# Patient Record
Sex: Female | Born: 1985 | Race: Black or African American | Hispanic: No | Marital: Single
Health system: Southern US, Community
[De-identification: ages and names within clinical notes are randomized; demographics above are authoritative.]

## PROBLEM LIST (undated history)

## (undated) DIAGNOSIS — F329 Major depressive disorder, single episode, unspecified: Secondary | ICD-10-CM

## (undated) DIAGNOSIS — N760 Acute vaginitis: Secondary | ICD-10-CM

## (undated) DIAGNOSIS — D649 Anemia, unspecified: Secondary | ICD-10-CM

## (undated) DIAGNOSIS — M549 Dorsalgia, unspecified: Secondary | ICD-10-CM

## (undated) DIAGNOSIS — G8929 Other chronic pain: Secondary | ICD-10-CM

## (undated) DIAGNOSIS — K219 Gastro-esophageal reflux disease without esophagitis: Secondary | ICD-10-CM

## (undated) DIAGNOSIS — F32A Depression, unspecified: Secondary | ICD-10-CM

## (undated) DIAGNOSIS — F419 Anxiety disorder, unspecified: Secondary | ICD-10-CM

## (undated) DIAGNOSIS — Z9289 Personal history of other medical treatment: Secondary | ICD-10-CM

## (undated) DIAGNOSIS — F209 Schizophrenia, unspecified: Secondary | ICD-10-CM

## (undated) DIAGNOSIS — S069X9A Unspecified intracranial injury with loss of consciousness of unspecified duration, initial encounter: Secondary | ICD-10-CM

## (undated) DIAGNOSIS — E119 Type 2 diabetes mellitus without complications: Secondary | ICD-10-CM

## (undated) DIAGNOSIS — N939 Abnormal uterine and vaginal bleeding, unspecified: Secondary | ICD-10-CM

## (undated) DIAGNOSIS — F319 Bipolar disorder, unspecified: Secondary | ICD-10-CM

## (undated) DIAGNOSIS — B9689 Other specified bacterial agents as the cause of diseases classified elsewhere: Secondary | ICD-10-CM

## (undated) HISTORY — PX: FRACTURE SURGERY: SHX138

## (undated) HISTORY — PX: INDUCED ABORTION: SHX677

## (undated) HISTORY — DX: Abnormal uterine and vaginal bleeding, unspecified: N93.9

## (undated) HISTORY — DX: Other specified bacterial agents as the cause of diseases classified elsewhere: B96.89

## (undated) HISTORY — DX: Acute vaginitis: N76.0

---

## 2006-02-16 ENCOUNTER — Emergency Department (HOSPITAL_COMMUNITY): Admission: EM | Admit: 2006-02-16 | Discharge: 2006-02-16 | Payer: Self-pay | Admitting: Emergency Medicine

## 2006-04-12 ENCOUNTER — Ambulatory Visit (HOSPITAL_COMMUNITY): Admission: RE | Admit: 2006-04-12 | Discharge: 2006-04-12 | Payer: Self-pay | Admitting: Obstetrics and Gynecology

## 2006-07-21 ENCOUNTER — Observation Stay (HOSPITAL_COMMUNITY): Admission: EM | Admit: 2006-07-21 | Discharge: 2006-07-22 | Payer: Self-pay | Admitting: Obstetrics and Gynecology

## 2006-08-05 ENCOUNTER — Inpatient Hospital Stay (HOSPITAL_COMMUNITY): Admission: RE | Admit: 2006-08-05 | Discharge: 2006-08-07 | Payer: Self-pay | Admitting: Obstetrics and Gynecology

## 2008-08-10 ENCOUNTER — Emergency Department (HOSPITAL_COMMUNITY): Admission: EM | Admit: 2008-08-10 | Discharge: 2008-08-10 | Payer: Self-pay | Admitting: Emergency Medicine

## 2008-11-26 ENCOUNTER — Observation Stay (HOSPITAL_COMMUNITY): Admission: AD | Admit: 2008-11-26 | Discharge: 2008-11-27 | Payer: Self-pay | Admitting: Obstetrics and Gynecology

## 2008-12-13 ENCOUNTER — Other Ambulatory Visit: Payer: Self-pay | Admitting: Emergency Medicine

## 2008-12-13 ENCOUNTER — Observation Stay (HOSPITAL_COMMUNITY): Admission: AD | Admit: 2008-12-13 | Discharge: 2008-12-14 | Payer: Self-pay | Admitting: Obstetrics and Gynecology

## 2009-06-30 ENCOUNTER — Inpatient Hospital Stay (HOSPITAL_COMMUNITY): Admission: EM | Admit: 2009-06-30 | Discharge: 2009-07-05 | Payer: Self-pay | Admitting: Emergency Medicine

## 2009-06-30 DIAGNOSIS — S069X9A Unspecified intracranial injury with loss of consciousness of unspecified duration, initial encounter: Secondary | ICD-10-CM

## 2009-06-30 HISTORY — PX: CLOSED REDUCTION MANDIBULAR FRACTURE: SUR255

## 2009-06-30 HISTORY — DX: Unspecified intracranial injury with loss of consciousness of unspecified duration, initial encounter: S06.9X9A

## 2009-07-02 ENCOUNTER — Ambulatory Visit: Payer: Self-pay | Admitting: Physical Medicine & Rehabilitation

## 2009-07-05 ENCOUNTER — Inpatient Hospital Stay (HOSPITAL_COMMUNITY)
Admission: RE | Admit: 2009-07-05 | Discharge: 2009-07-12 | Payer: Self-pay | Admitting: Physical Medicine & Rehabilitation

## 2009-07-05 ENCOUNTER — Ambulatory Visit: Payer: Self-pay | Admitting: Physical Medicine & Rehabilitation

## 2009-07-18 ENCOUNTER — Encounter (HOSPITAL_COMMUNITY)
Admission: RE | Admit: 2009-07-18 | Discharge: 2009-08-17 | Payer: Self-pay | Admitting: Physical Medicine & Rehabilitation

## 2009-08-09 ENCOUNTER — Encounter
Admission: RE | Admit: 2009-08-09 | Discharge: 2009-08-29 | Payer: Self-pay | Admitting: Physical Medicine & Rehabilitation

## 2009-08-13 ENCOUNTER — Ambulatory Visit: Payer: Self-pay | Admitting: Physical Medicine & Rehabilitation

## 2009-08-20 ENCOUNTER — Ambulatory Visit (HOSPITAL_COMMUNITY): Admission: RE | Admit: 2009-08-20 | Discharge: 2009-08-20 | Payer: Self-pay | Admitting: Otolaryngology

## 2009-08-20 ENCOUNTER — Encounter: Admission: RE | Admit: 2009-08-20 | Discharge: 2009-08-20 | Payer: Self-pay | Admitting: Neurosurgery

## 2009-08-22 ENCOUNTER — Encounter (HOSPITAL_COMMUNITY)
Admission: RE | Admit: 2009-08-22 | Discharge: 2009-09-06 | Payer: Self-pay | Admitting: Physical Medicine & Rehabilitation

## 2009-09-09 ENCOUNTER — Encounter (HOSPITAL_COMMUNITY)
Admission: RE | Admit: 2009-09-09 | Discharge: 2009-10-09 | Payer: Self-pay | Admitting: Physical Medicine & Rehabilitation

## 2010-02-22 ENCOUNTER — Emergency Department (HOSPITAL_COMMUNITY): Admission: EM | Admit: 2010-02-22 | Discharge: 2010-02-22 | Payer: Self-pay | Admitting: Emergency Medicine

## 2010-09-28 ENCOUNTER — Encounter: Payer: Self-pay | Admitting: Neurosurgery

## 2010-10-13 ENCOUNTER — Emergency Department (HOSPITAL_COMMUNITY)
Admission: EM | Admit: 2010-10-13 | Discharge: 2010-10-13 | Disposition: A | Discharge status: Home / Self Care | Payer: Medicaid Other | Attending: Emergency Medicine | Admitting: Emergency Medicine

## 2010-10-13 DIAGNOSIS — K089 Disorder of teeth and supporting structures, unspecified: Secondary | ICD-10-CM | POA: Insufficient documentation

## 2010-10-13 DIAGNOSIS — K047 Periapical abscess without sinus: Secondary | ICD-10-CM | POA: Insufficient documentation

## 2010-12-10 LAB — COMPREHENSIVE METABOLIC PANEL
CO2: 26 mEq/L (ref 19–32)
Calcium: 9 mg/dL (ref 8.4–10.5)
GFR calc Af Amer: >60 mL/min (ref >60)
Glucose, Bld: 96 mg/dL (ref 70–99)
Sodium: 137 mEq/L (ref 135–145)
Total Protein: 7.7 g/dL (ref 6.0–8.3)

## 2010-12-10 LAB — DIFFERENTIAL
Basophils Absolute: 0.1 10*3/uL (ref 0.0–0.1)
Eosinophils Relative: 2 % (ref 0–5)
Lymphocytes Relative: 23 % (ref 12–46)
Lymphs Abs: 2.4 10*3/uL (ref 0.7–4.0)
Monocytes Absolute: 1.3 10*3/uL — ABNORMAL HIGH (ref 0.1–1.0)
Monocytes Relative: 13 % — ABNORMAL HIGH (ref 3–12)
Neutro Abs: 6.5 10*3/uL (ref 1.7–7.7)

## 2010-12-10 LAB — CBC
Hemoglobin: 10.4 g/dL — ABNORMAL LOW (ref 12.0–15.0)
MCHC: 33.3 g/dL (ref 30.0–36.0)
MCV: 90 fL (ref 78.0–100.0)
WBC: 10.4 10*3/uL (ref 4.0–10.5)

## 2010-12-11 LAB — RAPID URINE DRUG SCREEN, HOSP PERFORMED
Amphetamines: NOT DETECTED
Barbiturates: NOT DETECTED
Benzodiazepines: NOT DETECTED
Cocaine: POSITIVE — AB
Tetrahydrocannabinol: POSITIVE — AB

## 2010-12-11 LAB — URINALYSIS, MICROSCOPIC ONLY
Bilirubin Urine: NEGATIVE
Ketones, ur: NEGATIVE mg/dL
Protein, ur: NEGATIVE mg/dL
Urobilinogen, UA: 0.2 mg/dL (ref 0.0–1.0)

## 2010-12-11 LAB — COMPREHENSIVE METABOLIC PANEL
ALT: 49 U/L — ABNORMAL HIGH (ref 0–35)
AST: 76 U/L — ABNORMAL HIGH (ref 0–37)
Albumin: 3.4 g/dL — ABNORMAL LOW (ref 3.5–5.2)
Alkaline Phosphatase: 49 U/L (ref 39–117)
BUN: 10 mg/dL (ref 6–23)
Chloride: 103 mEq/L (ref 96–112)
GFR calc non Af Amer: >60 mL/min (ref >60)
Total Bilirubin: 0.4 mg/dL (ref 0.3–1.2)

## 2010-12-11 LAB — URINE CULTURE: Culture: NO GROWTH

## 2010-12-11 LAB — APTT: aPTT: 26 seconds (ref 24–37)

## 2010-12-11 LAB — LIPASE, BLOOD: Lipase: 15 U/L (ref 11–59)

## 2010-12-11 LAB — ETHANOL: Alcohol, Ethyl (B): <5 mg/dL (ref 0–10)

## 2010-12-11 LAB — URINALYSIS, ROUTINE W REFLEX MICROSCOPIC
Bilirubin Urine: NEGATIVE
Specific Gravity, Urine: 1.021 (ref 1.005–1.030)
pH: 5.5 (ref 5.0–8.0)

## 2010-12-11 LAB — DIFFERENTIAL
Basophils Absolute: 0.1 10*3/uL (ref 0.0–0.1)
Lymphocytes Relative: 8 % — ABNORMAL LOW (ref 12–46)
Monocytes Absolute: 1.5 10*3/uL — ABNORMAL HIGH (ref 0.1–1.0)
Neutro Abs: 19 10*3/uL — ABNORMAL HIGH (ref 1.7–7.7)

## 2010-12-11 LAB — CBC
Hemoglobin: 11.5 g/dL — ABNORMAL LOW (ref 12.0–15.0)
Platelets: 290 10*3/uL (ref 150–400)
Platelets: 316 10*3/uL (ref 150–400)
RDW: 13.1 % (ref 11.5–15.5)

## 2010-12-11 LAB — URINE MICROSCOPIC-ADD ON

## 2010-12-11 LAB — PROTIME-INR: INR: 1.06 (ref 0.00–1.49)

## 2010-12-17 LAB — PROTIME-INR: Prothrombin Time: 15.5 seconds — ABNORMAL HIGH (ref 11.6–15.2)

## 2010-12-17 LAB — POCT I-STAT, CHEM 8
BUN: 8 mg/dL (ref 6–23)
Chloride: 107 mEq/L (ref 96–112)
Creatinine, Ser: 1.3 mg/dL — ABNORMAL HIGH (ref 0.4–1.2)
Potassium: 3.3 mEq/L — ABNORMAL LOW (ref 3.5–5.1)
Sodium: 141 mEq/L (ref 135–145)
TCO2: 21 mmol/L (ref 0–100)

## 2010-12-17 LAB — CBC
HCT: 19.9 % — ABNORMAL LOW (ref 36.0–46.0)
Hemoglobin: 6.7 g/dL — CL (ref 12.0–15.0)
MCHC: 32.7 g/dL (ref 30.0–36.0)
Platelets: 478 10*3/uL — ABNORMAL HIGH (ref 150–400)
Platelets: 276 10*3/uL (ref 150–400)
RDW: 14.4 % (ref 11.5–15.5)
WBC: 6.9 10*3/uL (ref 4.0–10.5)

## 2010-12-17 LAB — SAMPLE TO BLOOD BANK

## 2010-12-17 LAB — DIFFERENTIAL
Eosinophils Relative: 1 % (ref 0–5)
Lymphocytes Relative: 54 % — ABNORMAL HIGH (ref 12–46)
Lymphs Abs: 3.7 10*3/uL (ref 0.7–4.0)
Neutro Abs: 2.5 10*3/uL (ref 1.7–7.7)

## 2010-12-17 LAB — CROSSMATCH
ABO/RH(D): O POS
ABO/RH(D): O POS
Antibody Screen: NEGATIVE
Antibody Screen: NEGATIVE

## 2010-12-17 LAB — HEMOGLOBIN AND HEMATOCRIT, BLOOD
HCT: 21.2 % — ABNORMAL LOW (ref 36.0–46.0)
Hemoglobin: 6.9 g/dL — CL (ref 12.0–15.0)

## 2010-12-17 LAB — BASIC METABOLIC PANEL
BUN: 8 mg/dL (ref 6–23)
Calcium: 7.7 mg/dL — ABNORMAL LOW (ref 8.4–10.5)
GFR calc non Af Amer: 56 mL/min — ABNORMAL LOW (ref >60)
Potassium: 3.3 mEq/L — ABNORMAL LOW (ref 3.5–5.1)
Sodium: 140 mEq/L (ref 135–145)

## 2010-12-17 LAB — APTT: aPTT: 25 seconds (ref 24–37)

## 2010-12-18 LAB — DIFFERENTIAL
Basophils Absolute: 0 10*3/uL (ref 0.0–0.1)
Eosinophils Relative: 2 % (ref 0–5)
Lymphocytes Relative: 37 % (ref 12–46)
Lymphs Abs: 2.9 10*3/uL (ref 0.7–4.0)
Monocytes Relative: 9 % (ref 3–12)

## 2010-12-18 LAB — CROSSMATCH
ABO/RH(D): O POS
Antibody Screen: NEGATIVE

## 2010-12-18 LAB — CBC
HCT: 21.5 % — ABNORMAL LOW (ref 36.0–46.0)
HCT: 30.9 % — ABNORMAL LOW (ref 36.0–46.0)
Hemoglobin: 10.3 g/dL — ABNORMAL LOW (ref 12.0–15.0)
MCHC: 33.6 g/dL (ref 30.0–36.0)
MCV: 88.9 fL (ref 78.0–100.0)
Platelets: 226 10*3/uL (ref 150–400)
Platelets: 316 10*3/uL (ref 150–400)
RDW: 13.1 % (ref 11.5–15.5)
WBC: 10.9 10*3/uL — ABNORMAL HIGH (ref 4.0–10.5)
WBC: 7.8 10*3/uL (ref 4.0–10.5)

## 2010-12-18 LAB — BASIC METABOLIC PANEL
BUN: 4 mg/dL — ABNORMAL LOW (ref 6–23)
Calcium: 8.6 mg/dL (ref 8.4–10.5)
GFR calc non Af Amer: >60 mL/min (ref >60)
Glucose, Bld: 115 mg/dL — ABNORMAL HIGH (ref 70–99)
Potassium: 3.4 mEq/L — ABNORMAL LOW (ref 3.5–5.1)
Sodium: 133 mEq/L — ABNORMAL LOW (ref 135–145)

## 2010-12-18 LAB — APTT: aPTT: 26 seconds (ref 24–37)

## 2010-12-18 LAB — ABO/RH: ABO/RH(D): O POS

## 2010-12-18 LAB — PROTIME-INR: Prothrombin Time: 14.7 seconds (ref 11.6–15.2)

## 2011-01-23 NOTE — Op Note (Signed)
NAME:  TANEIL, LAZARUS NO.:  [id number]   MEDICAL RECORD NO.:  [id number]           PATIENT TYPE:   LOCATION:                                 FACILITY:   PHYSICIAN:  Tilda Burrow, M.D.      DATE OF BIRTH:   DATE OF PROCEDURE:  08/06/2006  DATE OF DISCHARGE:                               OPERATIVE REPORT   DELIVERY NOTE   SURGEON:  Tilda Burrow, M.D.   DESCRIPTION OF PROCEDURE:  Patient progressed nicely to completely  dilated and was an excellent pusher with a small 36-week baby she went  rapidly to completely dilated and crowning.  Delivery was performed by  Tilda Burrow, M.D. in a controlled fashion over an intact perineum  with first-degree lacerations only, none requiring suture repair.   The infant was a healthy 5 pound 8 ounce female infant with Apgars of 9  and 9.  Cord blood samples were obtained and placenta was delivered  easily, intact, Tomasa Blase presentation.  Estimated blood loss 250 mL.  Sponge and needle counts were correct.      Tilda Burrow, M.D.  Electronically Signed     JVF/MEDQ  D:  08/05/2006  T:  08/06/2006  Job:  [id number]   cc:   Francoise Schaumann. Milford Cage DO, FAAP  Fax: [phone]

## 2011-01-23 NOTE — Op Note (Signed)
NAME:  FOREVER, ARECHIGA NO.:  [id number]   MEDICAL RECORD NO.:  [id number]          PATIENT TYPE:  INP   LOCATION:  A402                          FACILITY:  [facility]   PHYSICIAN:  Tilda Burrow, M.D. DATE OF BIRTH:  Jan 27, 1986   DATE OF PROCEDURE:  08/05/2006  DATE OF DISCHARGE:                               OPERATIVE REPORT   OPERATION/PROCEDURE:  Epidural catheter placement.   DESCRIPTION OF PROCEDURE:  Continuous lumbar epidural catheter placed at  1:40 p.m. using loss-of-resistance technique at L2-3 interspace with  successful identification of the epidural space on first attempt at 4 cm  beneath the skin with easy introduction of a 5 mL test dose of 1.5%  Xylocaine followed by 14 mL per hour continuous infusion.  The patient  had symmetric analgesic effect at T12 and will be given an additional 7  mL bolus for complete analgesia.  Pitocin will be initiated.  Fetal  heart rate tracing is reactive with no blood pressure change.      Tilda Burrow, M.D.  Electronically Signed     JVF/MEDQ  D:  08/05/2006  T:  08/06/2006  Job:  [id number]

## 2011-01-23 NOTE — H&P (Signed)
[name], Manning            ACCOUNT NO.:  [id number]   MEDICAL RECORD NO.:  [id number]          PATIENT TYPE:  INP   LOCATION:  LDR2                          FACILITY:  [facility]   PHYSICIAN:  Tilda Burrow, M.D. DATE OF BIRTH:  03/02/1986   DATE OF ADMISSION:  08/05/2006  DATE OF DISCHARGE:  LH                              HISTORY & PHYSICAL   CHIEF COMPLAINT:  Spontaneous rupture of membranes at 0930.   HISTORY OF PRESENT ILLNESS:  Nicole Manning is a 25 year old gravida 2, para  0, AB 1 with an EDC of September 02, 2006, based on a 15-week ultrasound.  Her 20-week ultrasound gave an [facility] of August 31, 2006, placing her at  36 weeks.  She began prenatal care when she was [redacted] weeks pregnant and  has had regular visits since then.  Prenatal course has basically been  uneventful.  Blood pressures have been 100s to 140s/60s to 80s.  Total  weight gain has been 25 pounds with appropriate fundal height growth.   PRENATAL LABORATORY DATA:  Blood type O positive.  Rubella immune.  HBSAG, HIV, HSV, RPR, gonorrhea and chlamydia and sickle screen are all  negative.  One hour GTT was normal at 74.   ALLERGIES:  NO KNOWN DRUG ALLERGIES.   MEDICATIONS:  Prenatal vitamins.   PAST MEDICAL HISTORY:  History of chlamydia.   PAST SURGICAL HISTORY:  None.   SOCIAL HISTORY:  Single.  Works at [employer].  Lives with her  grandparents.  The father of the baby is jailed on assault charges.  She  denies cigarette smoking, alcohol or drug use.   FAMILY HISTORY:  Positive for hypertension, coronary artery disease.   OBSTETRICAL HISTORY:  An elective AB in 2006.   PHYSICAL EXAMINATION:  HEENT:  Within normal limits.  CARDIOVASCULAR:  Regular rate and rhythm.  ABDOMEN:  Soft and nontender.  She is having mild contractions every 3-4  minutes.  Fetal heart rate is reactive without decelerations.  CERVIX:  Grossly ruptured clear fluid.  Cervix is 3-4 cm, 100% effaced,  0 station, vertex  presentation.  EXTREMITIES:  Legs are negative.   IMPRESSION:  1. Intrauterine pregnancy at 36 weeks.  2. Preterm rupture of membranes.  3. Beginning active labor.   PLAN:  We will provide GBS prophylaxis.  The patient requests an  epidural at this time.      Jacklyn Shell, C.N.M.      Tilda Burrow, M.D.  Electronically Signed    FC/MEDQ  D:  08/05/2006  T:  08/05/2006  Job:  [id number]   cc:   [facility]   Dr. Gerda Diss
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
## 2011-05-12 ENCOUNTER — Encounter: Payer: Self-pay | Admitting: Emergency Medicine

## 2011-05-12 ENCOUNTER — Emergency Department (HOSPITAL_COMMUNITY)
Admission: EM | Admit: 2011-05-12 | Discharge: 2011-05-12 | Disposition: A | Discharge status: Home / Self Care | Payer: Medicaid Other | Attending: Emergency Medicine | Admitting: Emergency Medicine

## 2011-05-12 DIAGNOSIS — S40029A Contusion of unspecified upper arm, initial encounter: Secondary | ICD-10-CM | POA: Insufficient documentation

## 2011-05-12 DIAGNOSIS — S40019A Contusion of unspecified shoulder, initial encounter: Secondary | ICD-10-CM | POA: Insufficient documentation

## 2011-05-12 DIAGNOSIS — M549 Dorsalgia, unspecified: Secondary | ICD-10-CM | POA: Insufficient documentation

## 2011-05-12 DIAGNOSIS — F17200 Nicotine dependence, unspecified, uncomplicated: Secondary | ICD-10-CM | POA: Insufficient documentation

## 2011-05-12 DIAGNOSIS — M542 Cervicalgia: Secondary | ICD-10-CM | POA: Insufficient documentation

## 2011-05-12 DIAGNOSIS — Y92410 Unspecified street and highway as the place of occurrence of the external cause: Secondary | ICD-10-CM | POA: Insufficient documentation

## 2011-05-12 DIAGNOSIS — IMO0002 Reserved for concepts with insufficient information to code with codable children: Secondary | ICD-10-CM | POA: Insufficient documentation

## 2011-05-12 DIAGNOSIS — R51 Headache: Secondary | ICD-10-CM | POA: Insufficient documentation

## 2011-05-12 DIAGNOSIS — R079 Chest pain, unspecified: Secondary | ICD-10-CM | POA: Insufficient documentation

## 2011-05-12 MED ORDER — DIAZEPAM 5 MG PO TABS
5.0000 mg | ORAL_TABLET | Freq: Once | ORAL | Status: AC
  Administered 2011-05-12: 5 mg via ORAL
  Filled 2011-05-12: qty 1

## 2011-05-12 MED ORDER — OXYCODONE-ACETAMINOPHEN 5-325 MG PO TABS
1.0000 | ORAL_TABLET | Freq: Once | ORAL | Status: AC
  Administered 2011-05-12: 1 via ORAL
  Filled 2011-05-12: qty 1
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
## 2013-04-19 ENCOUNTER — Emergency Department (HOSPITAL_COMMUNITY)
Admission: EM | Admit: 2013-04-19 | Discharge: 2013-04-19 | Payer: Medicaid Other | Attending: Emergency Medicine | Admitting: Emergency Medicine

## 2013-04-19 ENCOUNTER — Encounter (HOSPITAL_COMMUNITY): Payer: Self-pay | Admitting: Emergency Medicine

## 2013-04-19 DIAGNOSIS — IMO0002 Reserved for concepts with insufficient information to code with codable children: Secondary | ICD-10-CM | POA: Insufficient documentation

## 2013-04-19 DIAGNOSIS — X58XXXA Exposure to other specified factors, initial encounter: Secondary | ICD-10-CM | POA: Insufficient documentation

## 2013-04-19 DIAGNOSIS — Y929 Unspecified place or not applicable: Secondary | ICD-10-CM | POA: Insufficient documentation

## 2013-04-19 DIAGNOSIS — Z8739 Personal history of other diseases of the musculoskeletal system and connective tissue: Secondary | ICD-10-CM | POA: Insufficient documentation

## 2013-04-19 DIAGNOSIS — S46911A Strain of unspecified muscle, fascia and tendon at shoulder and upper arm level, right arm, initial encounter: Secondary | ICD-10-CM

## 2013-04-19 DIAGNOSIS — F411 Generalized anxiety disorder: Secondary | ICD-10-CM | POA: Insufficient documentation

## 2013-04-19 DIAGNOSIS — Y9389 Activity, other specified: Secondary | ICD-10-CM | POA: Insufficient documentation

## 2013-04-19 DIAGNOSIS — F17200 Nicotine dependence, unspecified, uncomplicated: Secondary | ICD-10-CM | POA: Insufficient documentation

## 2013-04-19 MED ORDER — METHOCARBAMOL 500 MG PO TABS
1000.0000 mg | ORAL_TABLET | Freq: Once | ORAL | Status: AC
  Administered 2013-04-19: 1000 mg via ORAL
  Filled 2013-04-19: qty 2

## 2013-04-19 MED ORDER — KETOROLAC TROMETHAMINE 10 MG PO TABS
10.0000 mg | ORAL_TABLET | Freq: Once | ORAL | Status: AC
  Administered 2013-04-19: 10 mg via ORAL
  Filled 2013-04-19: qty 1
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
## 2013-04-21 NOTE — ED Provider Notes (Signed)
Medical screening examination/treatment/procedure(s) were performed by non-physician practitioner and as supervising physician I was immediately available for consultation/collaboration.   Jene Huq, MD 04/21/13 0647 

## 2013-10-03 ENCOUNTER — Encounter: Payer: Self-pay | Admitting: *Deleted

## 2013-10-03 ENCOUNTER — Encounter: Payer: Self-pay | Admitting: Advanced Practice Midwife

## 2013-10-18 ENCOUNTER — Other Ambulatory Visit: Payer: Self-pay | Admitting: Advanced Practice Midwife

## 2013-10-18 ENCOUNTER — Encounter: Payer: Self-pay | Admitting: *Deleted

## 2014-01-24 ENCOUNTER — Ambulatory Visit (INDEPENDENT_AMBULATORY_CARE_PROVIDER_SITE_OTHER): Payer: Medicaid Other | Admitting: Adult Health

## 2014-01-24 ENCOUNTER — Encounter: Payer: Self-pay | Admitting: Adult Health

## 2014-01-24 VITALS — BP 140/84 | Ht 61.0 in | Wt 206.0 lb

## 2014-01-24 DIAGNOSIS — N939 Abnormal uterine and vaginal bleeding, unspecified: Secondary | ICD-10-CM | POA: Insufficient documentation

## 2014-01-24 DIAGNOSIS — N760 Acute vaginitis: Secondary | ICD-10-CM

## 2014-01-24 DIAGNOSIS — B9689 Other specified bacterial agents as the cause of diseases classified elsewhere: Secondary | ICD-10-CM

## 2014-01-24 DIAGNOSIS — N926 Irregular menstruation, unspecified: Secondary | ICD-10-CM

## 2014-01-24 DIAGNOSIS — A499 Bacterial infection, unspecified: Secondary | ICD-10-CM

## 2014-01-24 DIAGNOSIS — Z3202 Encounter for pregnancy test, result negative: Secondary | ICD-10-CM

## 2014-01-24 HISTORY — DX: Acute vaginitis: B96.89

## 2014-01-24 HISTORY — DX: Abnormal uterine and vaginal bleeding, unspecified: N93.9

## 2014-01-24 LAB — POCT WET PREP (WET MOUNT): WBC WET PREP: POSITIVE

## 2014-01-24 LAB — POCT URINE PREGNANCY: Preg Test, Ur: NEGATIVE

## 2014-01-24 MED ORDER — METRONIDAZOLE 500 MG PO TABS: 500.0000 mg | ORAL_TABLET | Freq: Two times a day (BID) | ORAL | Status: DC

## 2014-01-24 NOTE — Patient Instructions (Signed)
Bacterial Vaginosis Bacterial vaginosis is a vaginal infection that occurs when the normal balance of bacteria in the vagina is disrupted. It results from an overgrowth of certain bacteria. This is the most common vaginal infection in women of childbearing age. Treatment is important to prevent complications, especially in pregnant women, as it can cause a premature delivery. CAUSES  Bacterial vaginosis is caused by an increase in harmful bacteria that are normally present in smaller amounts in the vagina. Several different kinds of bacteria can cause bacterial vaginosis. However, the reason that the condition develops is not fully understood. RISK FACTORS Certain activities or behaviors can put you at an increased risk of developing bacterial vaginosis, including:  Having a new sex partner or multiple sex partners.  Douching.  Using an intrauterine device (IUD) for contraception. Women do not get bacterial vaginosis from toilet seats, bedding, swimming pools, or contact with objects around them. SIGNS AND SYMPTOMS  Some women with bacterial vaginosis have no signs or symptoms. Common symptoms include:  Grey vaginal discharge.  A fishlike odor with discharge, especially after sexual intercourse.  Itching or burning of the vagina and vulva.  Burning or pain with urination. DIAGNOSIS  Your health care provider will take a medical history and examine the vagina for signs of bacterial vaginosis. A sample of vaginal fluid may be taken. Your health care provider will look at this sample under a microscope to check for bacteria and abnormal cells. A vaginal pH test may also be done.  TREATMENT  Bacterial vaginosis may be treated with antibiotic medicines. These may be given in the form of a pill or a vaginal cream. A second round of antibiotics may be prescribed if the condition comes back after treatment.  HOME CARE INSTRUCTIONS   Only take over-the-counter or prescription medicines as  directed by your health care provider.  If antibiotic medicine was prescribed, take it as directed. Make sure you finish it even if you start to feel better.  Do not have sex until treatment is completed.  Tell all sexual partners that you have a vaginal infection. They should see their health care provider and be treated if they have problems, such as a mild rash or itching.  Practice safe sex by using condoms and only having one sex partner. SEEK MEDICAL CARE IF:   Your symptoms are not improving after 3 days of treatment.  You have increased discharge or pain.  You have a fever. MAKE SURE YOU:   Understand these instructions.  Will watch your condition.  Will get help right away if you are not doing well or get worse. FOR MORE INFORMATION  Centers for Disease Control and Prevention, Division of STD Prevention: SolutionApps.co.zawww.cdc.gov/std American Sexual Health Association (ASHA): www.ashastd.org  Document Released: 08/24/2005 Document Revised: 06/14/2013 Document Reviewed: 04/05/2013 [facility] [employer] Patient Information 2014 [place], [place]. Return in 1 week [place]

## 2014-01-24 NOTE — Progress Notes (Signed)
Subjective:     Patient ID: Iron Belt Binghartaria S Accardi, female   DOB: 1985-10-15, 28 y.o.   MRN: [id number]  HPI Nicole Manning is a 28 year old black female in complaining of bleeding on and of since end of April,is not using birth control.Bleeding is not associated with any pain.  Review of Systems See HPI Reviewed past medical,surgical, social and family history. Reviewed medications and allergies.     Objective:   Physical Exam BP 140/84  Ht 5\' 1"  (1.549 m)  Wt 206 lb (93.441 kg)  BMI 38.94 kg/m2  LMP 04/28/2015UPT negative,   Skin warm and dry.Pelvic: external genitalia is normal in appearance, vagina: pinkish discharge with odor, cervix:smooth and bulbous, uterus: normal size, shape and contour, non tender, no masses felt, adnexa: no masses some RLQ tenderness noted. Wet prep: + for clue cells and +WBCs. GC/CHL obtained.   Assessment:     AUB BV    Plan:    GC/CHL Rx flagyl 500 mg 1 bid x 7 days, no alcohol, review handout on BV   Return in 1 week for US and see me

## 2014-01-25 LAB — GC/CHLAMYDIA PROBE AMP
CT PROBE, AMP APTIMA: NEGATIVE
GC PROBE AMP APTIMA: NEGATIVE

## 2014-01-26 ENCOUNTER — Other Ambulatory Visit: Payer: Self-pay | Admitting: Adult Health

## 2014-01-26 DIAGNOSIS — N939 Abnormal uterine and vaginal bleeding, unspecified: Secondary | ICD-10-CM

## 2014-01-30 ENCOUNTER — Other Ambulatory Visit: Payer: Medicaid Other

## 2014-01-30 ENCOUNTER — Ambulatory Visit: Payer: Medicaid Other | Admitting: Adult Health

## 2014-02-07 ENCOUNTER — Other Ambulatory Visit: Payer: Medicaid Other

## 2014-02-07 ENCOUNTER — Ambulatory Visit: Payer: Medicaid Other | Admitting: Adult Health

## 2014-04-20 ENCOUNTER — Encounter (HOSPITAL_COMMUNITY): Payer: Self-pay | Admitting: Emergency Medicine

## 2014-04-20 ENCOUNTER — Emergency Department (HOSPITAL_COMMUNITY)
Admission: EM | Admit: 2014-04-20 | Discharge: 2014-04-20 | Disposition: A | Discharge status: Home / Self Care | Payer: Medicaid Other | Attending: Emergency Medicine | Admitting: Emergency Medicine

## 2014-04-20 DIAGNOSIS — M25559 Pain in unspecified hip: Secondary | ICD-10-CM | POA: Insufficient documentation

## 2014-04-20 DIAGNOSIS — N898 Other specified noninflammatory disorders of vagina: Secondary | ICD-10-CM | POA: Insufficient documentation

## 2014-04-20 DIAGNOSIS — Z3202 Encounter for pregnancy test, result negative: Secondary | ICD-10-CM | POA: Insufficient documentation

## 2014-04-20 DIAGNOSIS — B373 Candidiasis of vulva and vagina: Secondary | ICD-10-CM | POA: Insufficient documentation

## 2014-04-20 DIAGNOSIS — B3731 Acute candidiasis of vulva and vagina: Secondary | ICD-10-CM | POA: Insufficient documentation

## 2014-04-20 DIAGNOSIS — F17200 Nicotine dependence, unspecified, uncomplicated: Secondary | ICD-10-CM | POA: Diagnosis not present

## 2014-04-20 LAB — URINALYSIS, ROUTINE W REFLEX MICROSCOPIC
BILIRUBIN URINE: NEGATIVE
Glucose, UA: NEGATIVE mg/dL
KETONES UR: NEGATIVE mg/dL
NITRITE: NEGATIVE
PH: 5.5 (ref 5.0–8.0)
Protein, ur: NEGATIVE mg/dL
SPECIFIC GRAVITY, URINE: 1.025 (ref 1.005–1.030)
Urobilinogen, UA: 0.2 mg/dL (ref 0.0–1.0)

## 2014-04-20 LAB — WET PREP, GENITAL
CLUE CELLS WET PREP: NONE SEEN
TRICH WET PREP: NONE SEEN

## 2014-04-20 LAB — URINE MICROSCOPIC-ADD ON

## 2014-04-20 LAB — POC URINE PREG, ED: PREG TEST UR: NEGATIVE

## 2014-04-20 MED ORDER — FLUCONAZOLE 100 MG PO TABS
150.0000 mg | ORAL_TABLET | Freq: Once | ORAL | Status: AC
  Administered 2014-04-20: 150 mg via ORAL
  Filled 2014-04-20: qty 2

## 2014-04-20 NOTE — ED Provider Notes (Signed)
CSN: [id number]     Arrival date & time 04/20/14  1019 History   First MD Initiated Contact with Patient 04/20/14 1030     Chief Complaint  Patient presents with  . Vaginal Discharge     (Consider location/radiation/quality/duration/timing/severity/associated sxs/prior Treatment) Patient is a 28 y.o. female presenting with vaginal discharge. The history is provided by the patient.  Vaginal Discharge Quality:  White Severity:  Moderate Duration:  1 week Timing:  Constant Progression:  Worsening Chronicity:  Recurrent Context: spontaneously   Relieved by:  Nothing Worsened by:  Nothing tried Ineffective treatments:  None tried Associated symptoms: vaginal itching   Associated symptoms: no abdominal pain, no dysuria, no fever, no nausea, no rash, no urinary hesitancy, no urinary incontinence and no vomiting   Risk factors: no STI and no STI exposure    28 yo F with a chief complaint of vaginal discharge that on for a week. Patient denies any fevers any vomiting any abdominal pain. Patient with some vaginal pain. Patient denies any prior STDs. Patient not seen any OB currently as she has just moved. Patient recently seen by her locally OB about 3 months ago diagnosed with BPH treated. Patient feels that this discharge may be different than the prior. Patient's last menstrual cycle started on the fourth of this month.  Past Medical History  Diagnosis Date  . Back pain   . Abnormal uterine bleeding (AUB) 01/24/2014  . BV (bacterial vaginosis) 01/24/2014   Past Surgical History  Procedure Laterality Date  . Mandible fracture surgery     Family History  Problem Relation Age of Onset  . Diabetes Father   . Hypertension Paternal Grandmother    History  Substance Use Topics  . Smoking status: Current Some Day Smoker -- 0.50 packs/day    Types: Cigars  . Smokeless tobacco: Never Used  . Alcohol Use: Yes     Comment: occassionally   OB History   Grav Para Term Preterm Abortions  TAB SAB Ect Mult Living   1 1        1      Review of Systems  Constitutional: Negative for fever and chills.  HENT: Negative for congestion and rhinorrhea.   Eyes: Negative for redness and visual disturbance.  Respiratory: Negative for shortness of breath and wheezing.   Cardiovascular: Negative for chest pain and palpitations.  Gastrointestinal: Negative for nausea, vomiting and abdominal pain.  Genitourinary: Positive for vaginal discharge and pelvic pain. Negative for bladder incontinence, dysuria, hesitancy and urgency.  Musculoskeletal: Negative for arthralgias and myalgias.  Skin: Negative for pallor and wound.  Neurological: Negative for dizziness and headaches.      Allergies  Review of patient's allergies indicates no known allergies.  Home Medications   Prior to Admission medications   Medication Sig Start Date End Date Taking? Authorizing Provider  ibuprofen (ADVIL,MOTRIN) 200 MG tablet Take 400 mg by mouth 2 (two) times daily as needed for headache or moderate pain.   Yes Historical Provider, MD  tetrahydrozoline-zinc (VISINE-AC) 0.05-0.25 % ophthalmic solution Place 1-2 drops into both eyes daily as needed (allergies).    Yes Historical Provider, MD   BP 112/65  Pulse 53  Temp(Src) 98.1 F (36.7 C) (Oral)  Resp 19  Ht 5\' 1"  (1.549 m)  Wt 194 lb (87.998 kg)  BMI 36.67 kg/m2  SpO2 98%  LMP 04/11/2014 Physical Exam  Constitutional: She is oriented to person, place, and time. She appears well-developed and well-nourished. No distress.  HENT:  Head: Normocephalic and atraumatic.  Eyes: EOM are normal. Pupils are equal, round, and reactive to light.  Neck: Normal range of motion. Neck supple.  Cardiovascular: Normal rate and regular rhythm.  Exam reveals no gallop and no friction rub.   No murmur heard. Pulmonary/Chest: Effort normal. She has no wheezes. She has no rales.  Abdominal: Soft. She exhibits no distension. There is no tenderness. There is no rebound  and no guarding.  Genitourinary: Cervix exhibits discharge (white frothy). Cervix exhibits no motion tenderness. Right adnexum displays no mass, no tenderness and no fullness. Left adnexum displays no mass, no tenderness and no fullness.  Musculoskeletal: She exhibits no edema and no tenderness.  Neurological: She is alert and oriented to person, place, and time.  Skin: Skin is warm and dry. She is not diaphoretic.  Psychiatric: She has a normal mood and affect. Her behavior is normal.    ED Course  Procedures (including critical care time) Labs Review Labs Reviewed  WET PREP, GENITAL - Abnormal; Notable for the following:    Yeast Wet Prep HPF POC MANY (*)    WBC, Wet Prep HPF POC MANY (*)    All other components within normal limits  URINALYSIS, ROUTINE W REFLEX MICROSCOPIC - Abnormal; Notable for the following:    APPearance CLOUDY (*)    Hgb urine dipstick TRACE (*)    Leukocytes, UA MODERATE (*)    All other components within normal limits  URINE MICROSCOPIC-ADD ON - Abnormal; Notable for the following:    Squamous Epithelial / LPF MANY (*)    Bacteria, UA MANY (*)    All other components within normal limits  GC/CHLAMYDIA PROBE AMP  POC URINE PREG, ED    Imaging Review No results found.   EKG Interpretation None      MDM   Final diagnoses:  Yeast infection of the vagina    28 yo F with a chief complaint of vaginal discharge. Patient has no adnexal tenderness patient with no cervical motion tenderness. Patient found to have yeast on wet prep patient with a dirty urine. We'll treat with diflucan one time here given followup instructions for OB here as well as PCP.  11:17 AM:  I have discussed the diagnosis/risks/treatment options with the patient and believe the pt to be eligible for discharge home to follow-up with PCP, OB. We also discussed returning to the ED immediately if new or worsening sx occur. We discussed the sx which are most concerning (e.g., one sided  TTP) that necessitate immediate return. Medications administered to the patient during their visit and any new prescriptions provided to the patient are listed below.  Medications given during this visit Medications  fluconazole (DIFLUCAN) tablet 150 mg (not administered)    New Prescriptions   No medications on file       Melene Plan, MD 04/20/14 1117

## 2014-04-20 NOTE — ED Notes (Signed)
Pt reports for about a week having vaginal pain and brown discharge; tried to get appt with PCP but unable to see if for a month and pain has gotten worse

## 2014-04-20 NOTE — Discharge Instructions (Signed)
Follow up with your PCP and OB.  Candidal Vulvovaginitis Candidal vulvovaginitis is an infection of the vagina and vulva. The vulva is the skin around the opening of the vagina. This may cause itching and discomfort in and around the vagina.  HOME CARE  Only take medicine as told by your doctor.  Do not have sex (intercourse) until the infection is healed or as told by your doctor.  Practice safe sex.  Tell your sex partner about your infection.  Do not douche or use tampons.  Wear cotton underwear. Do not wear tight pants or panty hose.  Eat yogurt. This may help treat and prevent yeast infections. GET HELP RIGHT AWAY IF:   You have a fever.  Your problems get worse during treatment or do not get better in 3 days.  You have discomfort, irritation, or itching in your vagina or vulva area.  You have pain after sex.  You start to get belly (abdominal) pain. MAKE SURE YOU:  Understand these instructions.  Will watch your condition.  Will get help right away if you are not doing well or get worse. Document Released: 11/20/2008 Document Revised: 08/29/2013 Document Reviewed: 11/20/2008 [facility] Patient Information 2015 [employer], [place]. This information is not intended to replace advice given to you by your health care provider. Make sure you discuss any questions you have with your health care provider.

## 2014-04-21 LAB — GC/CHLAMYDIA PROBE AMP
CT Probe RNA: NEGATIVE
GC PROBE AMP APTIMA: NEGATIVE

## 2014-04-23 NOTE — ED Provider Notes (Signed)
I saw and evaluated the patient, reviewed the resident's note and I agree with the findings and plan.   EKG Interpretation None       Tx for yeast infection  Audree CamelScott T Erastus Bartolomei, MD 04/23/14 1337

## 2014-07-09 ENCOUNTER — Encounter (HOSPITAL_COMMUNITY): Payer: Self-pay | Admitting: Emergency Medicine

## 2014-09-12 ENCOUNTER — Emergency Department (HOSPITAL_COMMUNITY)
Admission: EM | Admit: 2014-09-12 | Discharge: 2014-09-12 | Discharge status: Against Medical Advice | Payer: Medicaid Other | Attending: Emergency Medicine | Admitting: Emergency Medicine

## 2014-09-12 ENCOUNTER — Encounter (HOSPITAL_COMMUNITY): Payer: Self-pay | Admitting: Family Medicine

## 2014-09-12 DIAGNOSIS — Z5321 Procedure and treatment not carried out due to patient leaving prior to being seen by health care provider: Secondary | ICD-10-CM

## 2014-09-12 DIAGNOSIS — Z3202 Encounter for pregnancy test, result negative: Secondary | ICD-10-CM | POA: Diagnosis not present

## 2014-09-12 DIAGNOSIS — N898 Other specified noninflammatory disorders of vagina: Secondary | ICD-10-CM | POA: Diagnosis not present

## 2014-09-12 DIAGNOSIS — Z720 Tobacco use: Secondary | ICD-10-CM | POA: Diagnosis not present

## 2014-09-12 LAB — URINALYSIS, ROUTINE W REFLEX MICROSCOPIC
BILIRUBIN URINE: NEGATIVE
GLUCOSE, UA: NEGATIVE mg/dL
Hgb urine dipstick: NEGATIVE
Ketones, ur: NEGATIVE mg/dL
LEUKOCYTES UA: NEGATIVE
Nitrite: NEGATIVE
PH: 8 (ref 5.0–8.0)
PROTEIN: NEGATIVE mg/dL
Specific Gravity, Urine: 1.024 (ref 1.005–1.030)
UROBILINOGEN UA: 1 mg/dL (ref 0.0–1.0)

## 2014-09-12 LAB — PREGNANCY, URINE: PREG TEST UR: NEGATIVE

## 2014-09-12 NOTE — ED Notes (Signed)
The pt cannot be located x 3

## 2014-09-12 NOTE — ED Notes (Signed)
Pt here for foul vaginal odor. sts boyfriend having the same symptoms. Denies pain,

## 2015-07-24 ENCOUNTER — Emergency Department (HOSPITAL_COMMUNITY): Payer: Medicaid Other

## 2015-07-24 ENCOUNTER — Emergency Department (HOSPITAL_COMMUNITY)
Admission: EM | Admit: 2015-07-24 | Discharge: 2015-07-24 | Disposition: A | Discharge status: Home / Self Care | Payer: Medicaid Other | Attending: Emergency Medicine | Admitting: Emergency Medicine

## 2015-07-24 ENCOUNTER — Encounter (HOSPITAL_COMMUNITY): Payer: Self-pay

## 2015-07-24 DIAGNOSIS — S60512A Abrasion of left hand, initial encounter: Secondary | ICD-10-CM | POA: Diagnosis not present

## 2015-07-24 DIAGNOSIS — W11XXXA Fall on and from ladder, initial encounter: Secondary | ICD-10-CM | POA: Insufficient documentation

## 2015-07-24 DIAGNOSIS — Z8742 Personal history of other diseases of the female genital tract: Secondary | ICD-10-CM | POA: Insufficient documentation

## 2015-07-24 DIAGNOSIS — S82892A Other fracture of left lower leg, initial encounter for closed fracture: Secondary | ICD-10-CM | POA: Insufficient documentation

## 2015-07-24 DIAGNOSIS — Y9289 Other specified places as the place of occurrence of the external cause: Secondary | ICD-10-CM | POA: Insufficient documentation

## 2015-07-24 DIAGNOSIS — Y9389 Activity, other specified: Secondary | ICD-10-CM | POA: Insufficient documentation

## 2015-07-24 DIAGNOSIS — S99912A Unspecified injury of left ankle, initial encounter: Secondary | ICD-10-CM | POA: Diagnosis present

## 2015-07-24 DIAGNOSIS — Y998 Other external cause status: Secondary | ICD-10-CM | POA: Insufficient documentation

## 2015-07-24 DIAGNOSIS — Z8619 Personal history of other infectious and parasitic diseases: Secondary | ICD-10-CM | POA: Diagnosis not present

## 2015-07-24 DIAGNOSIS — F17210 Nicotine dependence, cigarettes, uncomplicated: Secondary | ICD-10-CM | POA: Diagnosis not present

## 2015-07-24 MED ORDER — HYDROMORPHONE HCL 1 MG/ML IJ SOLN
1.0000 mg | Freq: Once | INTRAMUSCULAR | Status: AC
  Administered 2015-07-24: 1 mg via INTRAVENOUS
  Filled 2015-07-24: qty 1

## 2015-07-24 MED ORDER — ONDANSETRON HCL 4 MG/2ML IJ SOLN
4.0000 mg | Freq: Four times a day (QID) | INTRAMUSCULAR | Status: DC | PRN
Start: 2015-07-24 — End: 2015-07-24
  Administered 2015-07-24: 4 mg via INTRAVENOUS
  Filled 2015-07-24: qty 2

## 2015-07-24 MED ORDER — OXYCODONE-ACETAMINOPHEN 5-325 MG PO TABS: 1.0000 | ORAL_TABLET | ORAL | Status: DC | PRN

## 2015-07-24 NOTE — ED Notes (Signed)
Pt states she was standing on a ladder changing a light bulb and she feel off. Complain of left ankle and foot pain

## 2015-07-24 NOTE — ED Provider Notes (Signed)
CSN: [id number]     Arrival date & time 07/24/15  [id number] History  By signing my name below, I, Elon SpannerGarrett Cook, attest that this documentation has been prepared under the direction and in the presence of Azalia BilisKevin Flornce Record, MD. Electronically Signed: Elon SpannerGarrett Cook, ED Scribe. 07/24/2015. 8:33 AM.    Chief Complaint  Patient presents with  . Ankle Injury   The history is provided by the patient. No language interpreter was used.    HPI Comments: Nicole Manning is a 29 y.o. female who presents to the Emergency Department complaining of left ankle pain and swelling onset this morning after stepping down off a ladder.  During the incident, the patient planted and rolled her left ankel and sustained a small abrasion on her left hand after hitting her dresser.  She denies head trauma, LOC, CP, abdominal pain, other complaints.     Past Medical History  Diagnosis Date  . Back pain   . Abnormal uterine bleeding (AUB) 01/24/2014  . BV (bacterial vaginosis) 01/24/2014   Past Surgical History  Procedure Laterality Date  . Mandible fracture surgery     Family History  Problem Relation Age of Onset  . Diabetes Father   . Hypertension Paternal Grandmother    Social History  Substance Use Topics  . Smoking status: Current Some Day Smoker -- 0.50 packs/day    Types: Cigars  . Smokeless tobacco: Never Used  . Alcohol Use: Yes     Comment: occassionally   OB History    Gravida Para Term Preterm AB TAB SAB Ectopic Multiple Living   1 1        1      Review of Systems  A complete 10 system review of systems was obtained and all systems are negative except as noted in the HPI and PMH.   Allergies  Review of patient's allergies indicates no known allergies.  Home Medications   Prior to Admission medications   Medication Sig Start Date End Date Taking? Authorizing Provider  ibuprofen (ADVIL,MOTRIN) 200 MG tablet Take 400 mg by mouth 2 (two) times daily as needed for headache or moderate pain.     Historical Provider, MD  tetrahydrozoline-zinc (VISINE-AC) 0.05-0.25 % ophthalmic solution Place 1-2 drops into both eyes daily as needed (allergies).     Historical Provider, MD   There were no vitals taken for this visit. Physical Exam  Constitutional: She is oriented to person, place, and time. She appears well-developed and well-nourished.  HENT:  Head: Normocephalic.  Eyes: EOM are normal.  Neck: Normal range of motion.  Pulmonary/Chest: Effort normal.  Abdominal: She exhibits no distension.  Musculoskeletal: Normal range of motion.  Swelling of left ankle with associated deformity.  Normal PT and DP pulse of the left foot.  Normal sensation in foot.   Neurological: She is alert and oriented to person, place, and time.  Skin:  Small abrasion at the base of left thumb.    Psychiatric: She has a normal mood and affect.  Nursing note and vitals reviewed. full ROM of left thumb without tenderness or swelling. No bleeding  ED Course  Procedures (including critical care time)  SPLINT APPLICATION Authorized by: Lyanne CoAMPOS,Remmie Bembenek M Consent: Verbal consent obtained. Risks and benefits: risks, benefits and alternatives were discussed Consent given by: patient Splint applied by: nursing staff Location details: left lower extremity Splint type: short leg with stirrup Supplies used: plaster Post-procedure: The splinted body part was neurovascularly unchanged following the procedure. Patient tolerance: Patient tolerated the  procedure well with no immediate complications.      DIAGNOSTIC STUDIES: Oxygen Saturation is 100% on RA, normal by my interpretation.    COORDINATION OF CARE:  8:40 AM Will order pain medication, anti-emetic, and imaging of the left ankle.  Patient acknowledges and agrees with plan.     Labs Review Labs Reviewed - No data to display  Imaging Review Dg Ankle Complete Left  07/24/2015  CLINICAL DATA:  Larey Seat off a ladder this morning, rolled foot and ankle,  LEFT ankle pain, initial encounter EXAM: LEFT ANKLE COMPLETE - 3+ VIEW COMPARISON:  07/01/2009 FINDINGS: Marked soft tissue swelling. Comminuted mildly displaced distal LEFT tibial metaphyseal fracture with intra-articular extension at the ankle joint. Fracture extends to involve a nondisplaced medial malleolar fracture as well. Fibula appears grossly intact. No dislocation. Mild talar beaking. No definite tarsal fractures identified. IMPRESSION: Comminuted displaced intra-articular distal LEFT tibial fracture. Electronically Signed   By: Ulyses Southward M.D.   On: 07/24/2015 09:12   I have personally reviewed and evaluated these images and lab results as part of my medical decision-making.   EKG Interpretation None      MDM   Final diagnoses:  Closed left ankle fracture, initial encounter    Patient splinted for complex left ankle fracture.  This a closed fracture.  Patient will need orthopedic follow-up and likely operative repair.  Referred to on-call orthopedist Dr. Roda Shutters in [place].  CT left ankle obtained for accelerated follow-up and operative planning  I personally performed the services described in this documentation, which was scribed in my presence. The recorded information has been reviewed and is accurate.       Azalia Bilis, MD 07/24/15 [phone]

## 2015-07-24 NOTE — Discharge Instructions (Signed)
Cast or Splint Care °Casts and splints support injured limbs and keep bones from moving while they heal. It is important to care for your cast or splint at home.   °HOME CARE INSTRUCTIONS °· Keep the cast or splint uncovered during the drying period. It can take 24 to 48 hours to dry if it is made of plaster. A fiberglass cast will dry in less than 1 hour. °· Do not rest the cast on anything harder than a pillow for the first 24 hours. °· Do not put weight on your injured limb or apply pressure to the cast until your health care provider gives you permission. °· Keep the cast or splint dry. Wet casts or splints can lose their shape and may not support the limb as well. A wet cast that has lost its shape can also create harmful pressure on your skin when it dries. Also, wet skin can become infected. °· Cover the cast or splint with a plastic bag when bathing or when out in the rain or snow. If the cast is on the trunk of the body, take sponge baths until the cast is removed. °· If your cast does become wet, dry it with a towel or a blow dryer on the cool setting only. °· Keep your cast or splint clean. Soiled casts may be wiped with a moistened cloth. °· Do not place any hard or soft foreign objects under your cast or splint, such as cotton, toilet paper, lotion, or powder. °· Do not try to scratch the skin under the cast with any object. The object could get stuck inside the cast. Also, scratching could lead to an infection. If itching is a problem, use a blow dryer on a cool setting to relieve discomfort. °· Do not trim or cut your cast or remove padding from inside of it. °· Exercise all joints next to the injury that are not immobilized by the cast or splint. For example, if you have a long leg cast, exercise the hip joint and toes. If you have an arm cast or splint, exercise the shoulder, elbow, thumb, and fingers. °· Elevate your injured arm or leg on 1 or 2 pillows for the first 1 to 3 days to decrease  swelling and pain. It is best if you can comfortably elevate your cast so it is higher than your heart. °SEEK MEDICAL CARE IF:  °· Your cast or splint cracks. °· Your cast or splint is too tight or too loose. °· You have unbearable itching inside the cast. °· Your cast becomes wet or develops a soft spot or area. °· You have a bad smell coming from inside your cast. °· You get an object stuck under your cast. °· Your skin around the cast becomes red or raw. °· You have new pain or worsening pain after the cast has been applied. °SEEK IMMEDIATE MEDICAL CARE IF:  °· You have fluid leaking through the cast. °· You are unable to move your fingers or toes. °· You have discolored (blue or white), cool, painful, or very swollen fingers or toes beyond the cast. °· You have tingling or numbness around the injured area. °· You have severe pain or pressure under the cast. °· You have any difficulty with your breathing or have shortness of breath. °· You have chest pain. °  °This information is not intended to replace advice given to you by your health care provider. Make sure you discuss any questions you have with your health care   provider. °  °Document Released: 08/21/2000 Document Revised: 06/14/2013 Document Reviewed: 03/02/2013 °Elsevier Interactive Patient Education ©2016 [employer] ° °Tibial Fracture, Adult °A tibial fracture is a break in the larger bone of your lower leg (tibia). This bone is also called the shin bone. °CAUSES  °· Low-energy injuries, such as a fall from ground level.   °· High-energy injuries, such as motor vehicle injuries or high-speed sports collisions. °RISK FACTORS °· Jumping activities.   °· Repetitive stress, such as long-distance running.   °· Participation in sports.   °· Osteoporosis.   °· Advanced age.   °SIGNS AND SYMPTOMS °· Pain.   °· Swelling.   °· Inability to put weight on your injured leg.   °· Bone deformities at the site of your injury.   °· Bruising.   °DIAGNOSIS  °A tibial  fracture can usually be diagnosed using X-rays. °TREATMENT  °A tibial fracture will often be treated with simple immobilization. A cast or splint will be used on your leg to keep it from moving while it heals. If the injury caused parts of the bone to move out of place, your health care provider may reposition those parts before putting on your cast or splint. The cast or splint will remain in place until your health care provider thinks the bone has healed well enough. Then you can begin range-of-motion exercises to regain your knee motion. For severe injuries, surgery is sometimes needed to insert plates or screws into the injured area. °HOME CARE INSTRUCTIONS  °· If you have a plaster or fiberglass cast:   °¨ Do not try to scratch the skin under the cast using sharp or pointed objects.   °¨ Check the skin around the cast every day. You may put lotion on any red or sore areas.   °¨ Keep your cast dry and clean.   °· If you have a plaster splint:   °¨ Wear the splint as directed.   °¨ Loosen the elastic around the splint if your toes become numb, tingle, or turn cold or blue.   °· Do not put pressure on any part of your cast or splint until it is fully hardened.   °· Use a plastic bag to protect your cast or splint during bathing. Do not lower the cast or splint into water.   °· Use crutches as directed.   °· Take medicines only as directed by your health care provider.   °· Keep all follow-up visits as directed by your health care provider. This is important.   °SEEK MEDICAL CARE IF: °· Your pain is becoming worse rather than better or is not controlled with medicines.   °· You have increased swelling or redness in your foot.   °· You begin to lose feeling in your foot or toes.   °SEEK IMMEDIATE MEDICAL CARE IF:  °· Your foot or toes on the injured side feel cold or turn blue.   °· You develop severe pain in your injured leg, especially if the pain is increased with movement of your toes.   °MAKE SURE  YOU: °· Understand these instructions.   °· Will watch your condition.   °· Will get help right away if you are not doing well or get worse.   °  °This information is not intended to replace advice given to you by your health care provider. Make sure you discuss any questions you have with your health care provider. °  °Document Released: 05/19/2001 Document Revised: 01/08/2015 Document Reviewed: 10/18/2013 °Elsevier Interactive Patient Education ©2016 Elsevier Inc. ° °

## 2015-07-29 ENCOUNTER — Other Ambulatory Visit (HOSPITAL_COMMUNITY): Payer: Self-pay | Admitting: Orthopaedic Surgery

## 2015-08-06 ENCOUNTER — Encounter (HOSPITAL_COMMUNITY): Payer: Self-pay | Admitting: *Deleted

## 2015-08-06 MED ORDER — CEFAZOLIN SODIUM-DEXTROSE 2-3 GM-% IV SOLR
2.0000 g | INTRAVENOUS | Status: AC
  Administered 2015-08-07: 2 g via INTRAVENOUS
  Filled 2015-08-06: qty 50
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
## 2015-08-07 ENCOUNTER — Ambulatory Visit (HOSPITAL_COMMUNITY): Payer: Medicaid Other | Admitting: Anesthesiology

## 2015-08-07 ENCOUNTER — Encounter (HOSPITAL_COMMUNITY)
Admission: RE | Disposition: A | Discharge status: Home / Self Care | Payer: Self-pay | Source: Ambulatory Visit | Attending: Orthopaedic Surgery

## 2015-08-07 ENCOUNTER — Observation Stay (HOSPITAL_COMMUNITY)
Admission: RE | Admit: 2015-08-07 | Discharge: 2015-08-08 | Disposition: A | Discharge status: Home / Self Care | Payer: Medicaid Other | Source: Ambulatory Visit | Attending: Orthopaedic Surgery | Admitting: Orthopaedic Surgery

## 2015-08-07 ENCOUNTER — Ambulatory Visit (HOSPITAL_COMMUNITY): Payer: Medicaid Other

## 2015-08-07 ENCOUNTER — Encounter (HOSPITAL_COMMUNITY): Payer: Self-pay | Admitting: *Deleted

## 2015-08-07 DIAGNOSIS — Z6834 Body mass index (BMI) 34.0-34.9, adult: Secondary | ICD-10-CM | POA: Insufficient documentation

## 2015-08-07 DIAGNOSIS — Z9889 Other specified postprocedural states: Secondary | ICD-10-CM

## 2015-08-07 DIAGNOSIS — F319 Bipolar disorder, unspecified: Secondary | ICD-10-CM | POA: Diagnosis not present

## 2015-08-07 DIAGNOSIS — S82872A Displaced pilon fracture of left tibia, initial encounter for closed fracture: Secondary | ICD-10-CM | POA: Diagnosis not present

## 2015-08-07 DIAGNOSIS — Z419 Encounter for procedure for purposes other than remedying health state, unspecified: Secondary | ICD-10-CM

## 2015-08-07 DIAGNOSIS — Z87891 Personal history of nicotine dependence: Secondary | ICD-10-CM | POA: Insufficient documentation

## 2015-08-07 DIAGNOSIS — Z23 Encounter for immunization: Secondary | ICD-10-CM | POA: Insufficient documentation

## 2015-08-07 DIAGNOSIS — F419 Anxiety disorder, unspecified: Secondary | ICD-10-CM | POA: Diagnosis not present

## 2015-08-07 DIAGNOSIS — W1789XA Other fall from one level to another, initial encounter: Secondary | ICD-10-CM | POA: Insufficient documentation

## 2015-08-07 DIAGNOSIS — Z8781 Personal history of (healed) traumatic fracture: Secondary | ICD-10-CM

## 2015-08-07 DIAGNOSIS — Z79899 Other long term (current) drug therapy: Secondary | ICD-10-CM | POA: Insufficient documentation

## 2015-08-07 DIAGNOSIS — K219 Gastro-esophageal reflux disease without esophagitis: Secondary | ICD-10-CM | POA: Diagnosis not present

## 2015-08-07 DIAGNOSIS — E669 Obesity, unspecified: Secondary | ICD-10-CM | POA: Diagnosis not present

## 2015-08-07 HISTORY — DX: Anemia, unspecified: D64.9

## 2015-08-07 HISTORY — DX: Major depressive disorder, single episode, unspecified: F32.9

## 2015-08-07 HISTORY — PX: ORIF ANKLE FRACTURE: SHX5408

## 2015-08-07 HISTORY — DX: Depression, unspecified: F32.A

## 2015-08-07 HISTORY — DX: Other chronic pain: G89.29

## 2015-08-07 HISTORY — DX: Bipolar disorder, unspecified: F31.9

## 2015-08-07 HISTORY — DX: Unspecified intracranial injury with loss of consciousness of unspecified duration, initial encounter: S06.9X9A

## 2015-08-07 HISTORY — DX: Dorsalgia, unspecified: M54.9

## 2015-08-07 HISTORY — DX: Gastro-esophageal reflux disease without esophagitis: K21.9

## 2015-08-07 HISTORY — DX: Personal history of other medical treatment: Z92.89

## 2015-08-07 HISTORY — DX: Anxiety disorder, unspecified: F41.9

## 2015-08-07 HISTORY — PX: ORIF ANKLE FRACTURE: SUR919

## 2015-08-07 LAB — CBC
HEMATOCRIT: 36.3 % (ref 36.0–46.0)
HEMOGLOBIN: 11.4 g/dL — AB (ref 12.0–15.0)
MCH: 27.9 pg (ref 26.0–34.0)
MCHC: 31.4 g/dL (ref 30.0–36.0)
MCV: 89 fL (ref 78.0–100.0)
Platelets: 422 10*3/uL — ABNORMAL HIGH (ref 150–400)
RBC: 4.08 MIL/uL (ref 3.87–5.11)
RDW: 13.6 % (ref 11.5–15.5)
WBC: 7.8 10*3/uL (ref 4.0–10.5)

## 2015-08-07 LAB — HCG, SERUM, QUALITATIVE: Preg, Serum: NEGATIVE

## 2015-08-07 LAB — SURGICAL PCR SCREEN
MRSA, PCR: NEGATIVE
STAPHYLOCOCCUS AUREUS: POSITIVE — AB

## 2015-08-07 SURGERY — OPEN REDUCTION INTERNAL FIXATION (ORIF) ANKLE FRACTURE
Anesthesia: Regional | Site: Ankle | Laterality: Left

## 2015-08-07 MED ORDER — FENTANYL CITRATE (PF) 100 MCG/2ML IJ SOLN
100.0000 ug | Freq: Once | INTRAMUSCULAR | Status: AC
  Administered 2015-08-07: 100 ug via INTRAVENOUS

## 2015-08-07 MED ORDER — MIDAZOLAM HCL 2 MG/2ML IJ SOLN: 2.0000 mg | Freq: Once | INTRAMUSCULAR | Status: DC

## 2015-08-07 MED ORDER — ONDANSETRON HCL 4 MG PO TABS: 4.0000 mg | ORAL_TABLET | Freq: Four times a day (QID) | ORAL | Status: DC | PRN

## 2015-08-07 MED ORDER — OXYCODONE HCL 5 MG/5ML PO SOLN: 5.0000 mg | Freq: Once | ORAL | Status: DC | PRN

## 2015-08-07 MED ORDER — ONDANSETRON HCL 4 MG/2ML IJ SOLN: 4.0000 mg | Freq: Four times a day (QID) | INTRAMUSCULAR | Status: DC | PRN

## 2015-08-07 MED ORDER — KETOROLAC TROMETHAMINE 15 MG/ML IJ SOLN
15.0000 mg | Freq: Four times a day (QID) | INTRAMUSCULAR | Status: AC
  Administered 2015-08-07 – 2015-08-08 (×4): 15 mg via INTRAVENOUS
  Filled 2015-08-07 (×3): qty 1

## 2015-08-07 MED ORDER — ONDANSETRON HCL 4 MG PO TABS: 4.0000 mg | ORAL_TABLET | Freq: Three times a day (TID) | ORAL | Status: DC | PRN

## 2015-08-07 MED ORDER — LORATADINE 10 MG PO TABS: 10.0000 mg | ORAL_TABLET | Freq: Every day | ORAL | Status: DC | PRN

## 2015-08-07 MED ORDER — SODIUM CHLORIDE 0.9 % IV SOLN
INTRAVENOUS | Status: DC
  Administered 2015-08-07: 22:00:00 via INTRAVENOUS
  Administered 2015-08-07 – 2015-08-08 (×2): 125 mL/h via INTRAVENOUS

## 2015-08-07 MED ORDER — ACETAMINOPHEN 325 MG PO TABS
650.0000 mg | ORAL_TABLET | Freq: Four times a day (QID) | ORAL | Status: DC | PRN
Start: 2015-08-07 — End: 2015-08-08

## 2015-08-07 MED ORDER — BUPIVACAINE-EPINEPHRINE (PF) 0.5% -1:200000 IJ SOLN
INTRAMUSCULAR | Status: DC | PRN
  Administered 2015-08-07: 25 mL via PERINEURAL
  Administered 2015-08-07: 20 mL via PERINEURAL

## 2015-08-07 MED ORDER — PROMETHAZINE HCL 25 MG/ML IJ SOLN: 6.2500 mg | INTRAMUSCULAR | Status: DC | PRN

## 2015-08-07 MED ORDER — KETOROLAC TROMETHAMINE 15 MG/ML IJ SOLN
INTRAMUSCULAR | Status: AC
  Filled 2015-08-07: qty 1

## 2015-08-07 MED ORDER — OXYCODONE HCL 5 MG PO TABS
5.0000 mg | ORAL_TABLET | ORAL | Status: DC | PRN
  Administered 2015-08-08 (×4): 10 mg via ORAL
  Filled 2015-08-07 (×4): qty 2

## 2015-08-07 MED ORDER — LIDOCAINE HCL (CARDIAC) 20 MG/ML IV SOLN
INTRAVENOUS | Status: DC | PRN
  Administered 2015-08-07: 60 mg via INTRAVENOUS

## 2015-08-07 MED ORDER — SENNOSIDES-DOCUSATE SODIUM 8.6-50 MG PO TABS: 1.0000 | ORAL_TABLET | Freq: Every evening | ORAL | Status: DC | PRN

## 2015-08-07 MED ORDER — LACTATED RINGERS IV SOLN
INTRAVENOUS | Status: DC
  Administered 2015-08-07: 10:00:00 via INTRAVENOUS

## 2015-08-07 MED ORDER — HYDROMORPHONE HCL 1 MG/ML IJ SOLN: 0.2500 mg | INTRAMUSCULAR | Status: DC | PRN

## 2015-08-07 MED ORDER — INFLUENZA VAC SPLIT QUAD 0.5 ML IM SUSY
0.5000 mL | PREFILLED_SYRINGE | INTRAMUSCULAR | Status: AC
  Administered 2015-08-08: 0.5 mL via INTRAMUSCULAR
  Filled 2015-08-07: qty 0.5

## 2015-08-07 MED ORDER — FENTANYL CITRATE (PF) 100 MCG/2ML IJ SOLN
INTRAMUSCULAR | Status: DC | PRN
  Administered 2015-08-07: 25 ug via INTRAVENOUS
  Administered 2015-08-07: 50 ug via INTRAVENOUS
  Administered 2015-08-07: 25 ug via INTRAVENOUS

## 2015-08-07 MED ORDER — METOCLOPRAMIDE HCL 5 MG/ML IJ SOLN: 5.0000 mg | Freq: Three times a day (TID) | INTRAMUSCULAR | Status: DC | PRN

## 2015-08-07 MED ORDER — MIDAZOLAM HCL 2 MG/2ML IJ SOLN
INTRAMUSCULAR | Status: AC
Start: 2015-08-07 — End: 2015-08-07
  Administered 2015-08-07: 2 mg
  Filled 2015-08-07: qty 2

## 2015-08-07 MED ORDER — OXYCODONE HCL 5 MG PO TABS: 5.0000 mg | ORAL_TABLET | Freq: Once | ORAL | Status: DC | PRN

## 2015-08-07 MED ORDER — ONDANSETRON HCL 4 MG/2ML IJ SOLN
INTRAMUSCULAR | Status: DC | PRN
  Administered 2015-08-07: 4 mg via INTRAVENOUS

## 2015-08-07 MED ORDER — ENSURE ENLIVE PO LIQD
237.0000 mL | Freq: Two times a day (BID) | ORAL | Status: DC
  Administered 2015-08-08: 237 mL via ORAL

## 2015-08-07 MED ORDER — DIPHENHYDRAMINE HCL 12.5 MG/5ML PO ELIX: 25.0000 mg | ORAL_SOLUTION | ORAL | Status: DC | PRN

## 2015-08-07 MED ORDER — FENTANYL CITRATE (PF) 250 MCG/5ML IJ SOLN
INTRAMUSCULAR | Status: AC
  Filled 2015-08-07: qty 5

## 2015-08-07 MED ORDER — ACETAMINOPHEN 650 MG RE SUPP: 650.0000 mg | Freq: Four times a day (QID) | RECTAL | Status: DC | PRN

## 2015-08-07 MED ORDER — MORPHINE SULFATE (PF) 2 MG/ML IV SOLN
1.0000 mg | INTRAVENOUS | Status: DC | PRN
  Administered 2015-08-08 (×2): 1 mg via INTRAVENOUS
  Filled 2015-08-07 (×2): qty 1

## 2015-08-07 MED ORDER — OXYCODONE-ACETAMINOPHEN 5-325 MG PO TABS: 1.0000 | ORAL_TABLET | ORAL | Status: DC | PRN

## 2015-08-07 MED ORDER — ASPIRIN EC 325 MG PO TBEC: 325.0000 mg | DELAYED_RELEASE_TABLET | Freq: Two times a day (BID) | ORAL | Status: DC

## 2015-08-07 MED ORDER — PNEUMOCOCCAL VAC POLYVALENT 25 MCG/0.5ML IJ INJ
0.5000 mL | INJECTION | INTRAMUSCULAR | Status: DC
  Filled 2015-08-07: qty 0.5

## 2015-08-07 MED ORDER — OXYCODONE HCL ER 10 MG PO T12A: 10.0000 mg | EXTENDED_RELEASE_TABLET | Freq: Two times a day (BID) | ORAL | Status: DC

## 2015-08-07 MED ORDER — ASPIRIN EC 325 MG PO TBEC
325.0000 mg | DELAYED_RELEASE_TABLET | Freq: Two times a day (BID) | ORAL | Status: DC
  Administered 2015-08-07 – 2015-08-08 (×2): 325 mg via ORAL
  Filled 2015-08-07 (×2): qty 1

## 2015-08-07 MED ORDER — CEFAZOLIN SODIUM-DEXTROSE 2-3 GM-% IV SOLR
2.0000 g | Freq: Four times a day (QID) | INTRAVENOUS | Status: AC
  Administered 2015-08-07 – 2015-08-08 (×3): 2 g via INTRAVENOUS
  Filled 2015-08-07 (×3): qty 50

## 2015-08-07 MED ORDER — METOCLOPRAMIDE HCL 5 MG PO TABS: 5.0000 mg | ORAL_TABLET | Freq: Three times a day (TID) | ORAL | Status: DC | PRN

## 2015-08-07 MED ORDER — FENTANYL CITRATE (PF) 100 MCG/2ML IJ SOLN
INTRAMUSCULAR | Status: AC
  Filled 2015-08-07: qty 2

## 2015-08-07 MED ORDER — METHOCARBAMOL 500 MG PO TABS
500.0000 mg | ORAL_TABLET | Freq: Four times a day (QID) | ORAL | Status: DC | PRN
  Administered 2015-08-08 (×3): 500 mg via ORAL
  Filled 2015-08-07 (×3): qty 1

## 2015-08-07 MED ORDER — PROPOFOL 10 MG/ML IV BOLUS
INTRAVENOUS | Status: DC | PRN
  Administered 2015-08-07: 200 mg via INTRAVENOUS

## 2015-08-07 MED ORDER — METHOCARBAMOL 1000 MG/10ML IJ SOLN: 500.0000 mg | Freq: Four times a day (QID) | INTRAVENOUS | Status: DC | PRN

## 2015-08-07 SURGICAL SUPPLY — 71 items
BANDAGE ELASTIC 4 VELCRO ST LF (GAUZE/BANDAGES/DRESSINGS) IMPLANT
BANDAGE ELASTIC 6 VELCRO ST LF (GAUZE/BANDAGES/DRESSINGS) IMPLANT
BANDAGE ESMARK 6X9 LF (GAUZE/BANDAGES/DRESSINGS) IMPLANT
BIT DRILL 2.7 QC 7.9IN LONG (BIT) ×2 IMPLANT
BIT DRILL QC 2.7 6.3IN  SHORT (BIT) ×1
BIT DRILL QC 2.7 6.3IN SHORT (BIT) ×1 IMPLANT
BLADE SURG 15 STRL LF DISP TIS (BLADE) ×1 IMPLANT
BLADE SURG 15 STRL SS (BLADE) ×1
BNDG COHESIVE 4X5 TAN STRL (GAUZE/BANDAGES/DRESSINGS) ×2 IMPLANT
BNDG COHESIVE 6X5 TAN STRL LF (GAUZE/BANDAGES/DRESSINGS) IMPLANT
BNDG ESMARK 6X9 LF (GAUZE/BANDAGES/DRESSINGS)
BONE CANC CHIPS 20CC PCAN1/4 (Bone Implant) ×2 IMPLANT
CANISTER SUCT 3000ML PPV (MISCELLANEOUS) ×2 IMPLANT
CHIPS CANC BONE 20CC PCAN1/4 (Bone Implant) ×1 IMPLANT
COVER SURGICAL LIGHT HANDLE (MISCELLANEOUS) ×2 IMPLANT
CUFF TOURNIQUET SINGLE 34IN LL (TOURNIQUET CUFF) IMPLANT
CUFF TOURNIQUET SINGLE 44IN (TOURNIQUET CUFF) IMPLANT
DRAPE C-ARM 42X72 X-RAY (DRAPES) ×2 IMPLANT
DRAPE C-ARMOR (DRAPES) ×2 IMPLANT
DRAPE IMP U-DRAPE 54X76 (DRAPES) ×2 IMPLANT
DRAPE INCISE IOBAN 66X45 STRL (DRAPES) IMPLANT
DRAPE U-SHAPE 47X51 STRL (DRAPES) ×2 IMPLANT
DURAPREP 26ML APPLICATOR (WOUND CARE) ×2 IMPLANT
ELECT CAUTERY BLADE 6.4 (BLADE) ×2 IMPLANT
ELECT REM PT RETURN 9FT ADLT (ELECTROSURGICAL) ×2
ELECTRODE REM PT RTRN 9FT ADLT (ELECTROSURGICAL) ×1 IMPLANT
FACESHIELD WRAPAROUND (MASK) ×2 IMPLANT
GAUZE SPONGE 4X4 12PLY STRL (GAUZE/BANDAGES/DRESSINGS) ×2 IMPLANT
GAUZE XEROFORM 5X9 LF (GAUZE/BANDAGES/DRESSINGS) ×2 IMPLANT
GLOVE NEODERM STRL 7.5 LF PF (GLOVE) ×1 IMPLANT
GLOVE SURG NEODERM 7.5  LF PF (GLOVE) ×1
GLOVE SURG SYN 7.5  E (GLOVE) ×2
GLOVE SURG SYN 7.5 E (GLOVE) ×2 IMPLANT
GOWN STRL REIN XL XLG (GOWN DISPOSABLE) ×2 IMPLANT
K-WIRE 2.0 (WIRE) ×2
K-WIRE TROCAR PT 2.0 150MM (WIRE) ×2
KIT BASIN OR (CUSTOM PROCEDURE TRAY) ×2 IMPLANT
KIT ROOM TURNOVER OR (KITS) ×2 IMPLANT
KWIRE TROCAR PT 2.0 150MM (WIRE) ×2 IMPLANT
NEEDLE HYPO 25GX1X1/2 BEV (NEEDLE) IMPLANT
NS IRRIG 1000ML POUR BTL (IV SOLUTION) ×2 IMPLANT
PACK ORTHO EXTREMITY (CUSTOM PROCEDURE TRAY) ×2 IMPLANT
PAD ARMBOARD 7.5X6 YLW CONV (MISCELLANEOUS) ×4 IMPLANT
PAD CAST 3X4 CTTN HI CHSV (CAST SUPPLIES) ×2 IMPLANT
PADDING CAST COTTON 3X4 STRL (CAST SUPPLIES) ×2
PADDING CAST COTTON 6X4 STRL (CAST SUPPLIES) ×2 IMPLANT
PADDING CAST SYN 6 (CAST SUPPLIES) ×1
PADDING CAST SYNTHETIC 4 (CAST SUPPLIES) ×1
PADDING CAST SYNTHETIC 4X4 STR (CAST SUPPLIES) ×1 IMPLANT
PADDING CAST SYNTHETIC 6X4 NS (CAST SUPPLIES) ×1 IMPLANT
PLATE 8 HOLE ANTEROLATERAL TIB (Plate) ×2 IMPLANT
SCREW 3.5X24MM (Screw) ×6 IMPLANT
SCREW 3.5X40 (Screw) ×4 IMPLANT
SCREW 3.5X46 (Screw) ×2 IMPLANT
SCREW CORTICAL 3.5X22 (Screw) ×2 IMPLANT
SCREW LOCK 3.5X42 (Screw) ×2 IMPLANT
SCREW NONLOCK 3.5X44 (Screw) ×2 IMPLANT
SPLINT FIBERGLASS 3X35 (CAST SUPPLIES) ×2 IMPLANT
SPLINT PLASTER CAST XFAST 5X30 (CAST SUPPLIES) ×1 IMPLANT
SPLINT PLASTER XFAST SET 5X30 (CAST SUPPLIES) ×1
SPONGE GAUZE 4X4 12PLY STER LF (GAUZE/BANDAGES/DRESSINGS) ×2 IMPLANT
SPONGE LAP 18X18 X RAY DECT (DISPOSABLE) IMPLANT
SUCTION FRAZIER TIP 10 FR DISP (SUCTIONS) ×2 IMPLANT
SUT ETHILON 3 0 PS 1 (SUTURE) IMPLANT
SUT VIC AB 2-0 CT1 27 (SUTURE)
SUT VIC AB 2-0 CT1 TAPERPNT 27 (SUTURE) IMPLANT
SYR CONTROL 10ML LL (SYRINGE) IMPLANT
TOWEL OR 17X24 6PK STRL BLUE (TOWEL DISPOSABLE) ×2 IMPLANT
TOWEL OR 17X26 10 PK STRL BLUE (TOWEL DISPOSABLE) ×4 IMPLANT
TUBE CONNECTING 12X1/4 (SUCTIONS) ×2 IMPLANT
WATER STERILE IRR 1000ML POUR (IV SOLUTION) ×2 IMPLANT

## 2015-08-07 NOTE — Discharge Instructions (Signed)
° ° °  1. Keep splint clean and dry °2. Elevate foot above level of the heart °3. Take aspirin to prevent blood clots °4. Take pain meds as needed °5. Strict non weight bearing to operative extremity ° °

## 2015-08-07 NOTE — Transfer of Care (Signed)
Immediate Anesthesia Transfer of Care Note  Patient: Nicole Manning  Procedure(s) Performed: Procedure(s): OPEN REDUCTION INTERNAL FIXATION (ORIF) LEFT PILON FRACTURE (Left)  Patient Location: PACU  Anesthesia Type:GA combined with regional for post-op pain  Level of Consciousness: awake, alert  and oriented  Airway & Oxygen Therapy: Patient Spontanous Breathing and Patient connected to nasal cannula oxygen  Post-op Assessment: Report given to RN and Post -op Vital signs reviewed and stable  Post vital signs: Reviewed and stable  Last Vitals:  Filed Vitals:   08/07/15 1113 08/07/15 1345  BP: 139/66 113/72  Pulse: 77 104  Temp:    Resp: 21 15    Complications: No apparent anesthesia complications

## 2015-08-07 NOTE — Anesthesia Procedure Notes (Addendum)
Anesthesia Regional Block:  Adductor canal block  Pre-Anesthetic Checklist: ,, timeout performed, Correct Patient, Correct Site, Correct Laterality, Correct Procedure, Correct Position, site marked, Risks and benefits discussed,  Surgical consent,  Pre-op evaluation,  At surgeon's request and post-op pain management  Laterality: Left  Prep: chloraprep       Needles:   Needle Type: Echogenic Stimulator Needle     Needle Length: 5cm 5 cm Needle Gauge: 22 and 22 G    Additional Needles:  Procedures: ultrasound guided (picture in chart) Adductor canal block Narrative:  Injection made incrementally with aspirations every 5 mL.  Performed by: Personally  Anesthesiologist: [name], BENJAMIN  Additional Notes: Discussed risks of nerve block including failure, bleeding, infection, nerve damage.  Adductor canal nerve block does not usually prevent all pain. Specifically, it treats the saphenous nerve distribution. Questions answered.  Patient consents to block. No complications   Anesthesia Regional Block:  Popliteal block  Pre-Anesthetic Checklist: ,, timeout performed, Correct Patient, Correct Site, Correct Laterality, Correct Procedure, Correct Position, site marked, Risks and benefits discussed,  Surgical consent,  Pre-op evaluation,  At surgeon's request and post-op pain management  Laterality: Left  Prep: chloraprep       Needles:  Injection technique: Single-shot  Needle Type: Echogenic Stimulator Needle     Needle Length: 5cm 5 cm Needle Gauge: 22 and 22 G    Additional Needles:  Procedures: ultrasound guided (picture in chart) Popliteal block Narrative:  Injection made incrementally with aspirations every 5 mL.  Performed by: Personally  Anesthesiologist: [name], BENJAMIN  Additional Notes: Risks, benefits and alternative to block explained extensively.  Patient tolerated procedure well, without complications.   Procedure Name: LMA Insertion Date/Time:  08/07/2015 11:36 AM Performed by: Sharlene DoryWALKER, Dreydon Cardenas E Pre-anesthesia Checklist: Patient identified, Emergency Drugs available, Suction available, Patient being monitored and Timeout performed Patient Re-evaluated:Patient Re-evaluated prior to inductionOxygen Delivery Method: Circle system utilized Preoxygenation: Pre-oxygenation with 100% oxygen Intubation Type: IV induction Ventilation: Mask ventilation without difficulty LMA: LMA inserted LMA Size: 4.0 Number of attempts: 1 Placement Confirmation: positive ETCO2 and breath sounds checked- equal and bilateral Tube secured with: Tape Dental Injury: Teeth and Oropharynx as per pre-operative assessment

## 2015-08-07 NOTE — Anesthesia Postprocedure Evaluation (Signed)
Anesthesia Post Note  Patient: Nicole Manning  Procedure(s) Performed: Procedure(s) (LRB): OPEN REDUCTION INTERNAL FIXATION (ORIF) LEFT PILON FRACTURE (Left)  Patient location during evaluation: PACU Anesthesia Type: General Level of consciousness: awake and alert Pain management: pain level controlled Vital Signs Assessment: post-procedure vital signs reviewed and stable Respiratory status: spontaneous breathing, nonlabored ventilation, respiratory function stable and patient connected to nasal cannula oxygen Cardiovascular status: blood pressure returned to baseline and stable Postop Assessment: no signs of nausea or vomiting Anesthetic complications: no    Last Vitals:  Filed Vitals:   08/07/15 1415 08/07/15 1428  BP: 135/92   Pulse: 76 77  Temp:    Resp: 19 13    Last Pain:  Filed Vitals:   08/07/15 1433  PainSc: Asleep    LLE Motor Response: Purposeful movement, Responds to commands LLE Sensation: Numbness          Reino KentJudd, [name]

## 2015-08-07 NOTE — Anesthesia Preprocedure Evaluation (Signed)
Anesthesia Evaluation  Patient identified by MRN, date of birth, ID band Patient awake    Reviewed: Allergy & Precautions, H&P , NPO status , Patient's Chart, lab work & pertinent test results  History of Anesthesia Complications Negative for: history of anesthetic complications  Airway Mallampati: II  TM Distance: >3 FB Neck ROM: full    Dental no notable dental hx.    Pulmonary neg pulmonary ROS, former smoker,    Pulmonary exam normal breath sounds clear to auscultation       Cardiovascular negative cardio ROS Normal cardiovascular exam Rhythm:regular Rate:Normal     Neuro/Psych Anxiety Depression Bipolar Disorder negative neurological ROS     GI/Hepatic Neg liver ROS, GERD  Medicated,  Endo/Other  obesity  Renal/GU negative Renal ROS     Musculoskeletal   Abdominal (+) + obese,   Peds  Hematology negative hematology ROS (+)   Anesthesia Other Findings   Reproductive/Obstetrics negative OB ROS                             Anesthesia Physical Anesthesia Plan  ASA: II  Anesthesia Plan: General and Regional   Post-op Pain Management: MAC Combined w/ Regional for Post-op pain   Induction: Intravenous  Airway Management Planned: LMA  Additional Equipment:   Intra-op Plan:   Post-operative Plan: Extubation in OR  Informed Consent: I have reviewed the patients History and Physical, chart, labs and discussed the procedure including the risks, benefits and alternatives for the proposed anesthesia with the patient or authorized representative who has indicated his/her understanding and acceptance.   Dental Advisory Given  Plan Discussed with: Anesthesiologist, CRNA and Surgeon  Anesthesia Plan Comments:         Anesthesia Quick Evaluation

## 2015-08-07 NOTE — Op Note (Signed)
   Date of Surgery: 08/07/2015  INDICATIONS: Ms. Nicole Manning is a 29 y.o.-year-old female with a left pilon fracture from a fall from height approximately 12 days ago;  The patient did consent to the procedure after discussion of the risks and benefits.  PREOPERATIVE DIAGNOSIS: Left pilon fracture  POSTOPERATIVE DIAGNOSIS: Same.  PROCEDURE: Open reduction internal fixation left pilon fracture  SURGEON: N. Glee ArvinMichael Xu, M.D.  ASSIST: April Chilton SiGreen, RNFA.  ANESTHESIA:  general, regional  IV FLUIDS AND URINE: See anesthesia.  ESTIMATED BLOOD LOSS: minimal mL.  IMPLANTS: [employer] 8 hole anterolateral distal tibia plate  DRAINS: none  COMPLICATIONS: None.  DESCRIPTION OF PROCEDURE: The patient was brought to the operating room and placed supine on the operating table.  The patient had been signed prior to the procedure and this was documented. The patient had the anesthesia placed by the anesthesiologist.  A time-out was performed to confirm that this was the correct patient, site, side and location. The patient did receive antibiotics prior to the incision and was re-dosed during the procedure as needed at indicated intervals.  A tourniquet placed.  The patient had the operative extremity prepped and draped in the standard surgical fashion.    The tourniquet was inflated to 350 mmHg. A anterolateral approach to the distal tibia was utilized. Full-thickness flaps were created. The superficial peroneal nerve was identified and mobilized and protected. The extensor retinaculum was sharply incised. The extensor tendons were then retracted medially to expose the distal tibia. The tibial plafond fracture was then exposed. The anterior cortex was windowed open in order to gain visualization into the ankle joint. There was a significant amount of impaction of the plafond and. There is also a significant amount of bone loss. The articular surface was disimpacted and replaced back to the original  position as best as possible and this was then backfilled with cancellous bone chips. With the articular surface congruent we placed a 8 hole anterolateral plate on the distal tibia. Fluoroscopy was used to achieve appropriate positioning. 2 periarticular screws were placed through the plate in order to hold the articular surface. These were 2 nonlocking screws in order to suck the plate down and the fracture fragments together. We then placed 4 nonlocking screws in the shaft of the tibia in order to maximize working length. We then placed 2 more locking screws through the distal portion of the plate. Final x-rays were taken. The wound was thoroughly irrigated with normal saline. The extensor retinaculum was repaired. Rest of the surgical wound was closed in layer fashion using 2-0 Vicryl, 3-0 nylon. Sterile dressings were applied. Patient tolerated the procedure well was expanded and transferred to the PACU in stable condition.  POSTOPERATIVE PLAN: Patient will be strictly nonweightbearing to the left lower extremity. She will be admitted overnight for observation and physical therapy in the morning. We'll plan on discharging her in the morning.  Mayra ReelN. Michael Xu, MD [facility] [phone] 1:39 PM

## 2015-08-08 ENCOUNTER — Encounter (HOSPITAL_COMMUNITY): Payer: Self-pay | Admitting: Orthopaedic Surgery

## 2015-08-08 DIAGNOSIS — S82872A Displaced pilon fracture of left tibia, initial encounter for closed fracture: Secondary | ICD-10-CM | POA: Diagnosis not present

## 2015-08-08 NOTE — Evaluation (Signed)
Occupational Therapy Evaluation Patient Details Name: Nicole Bryan BingChartaria S Faughn MRN: [id number] DOB: 12/27/1985 Today's Date: 08/08/2015    History of Present Illness 29 y.o.-year-old female with a left pilon fracture from a fall from height approximately 12 days ago. S/p Open reduction internal fixation left pilon fracture. PMH: head injury, GERD, depression, anxiety, bipolar disorder      Clinical Impression   Pt reports she was independent with ADLs PTA. Pt currently overall supervision for safety with ADLs and mobility. Began ADL and safety education with pt; she verbalized understanding. Pt planning to d/c home with 24/7 supervision from family, however, they are unable to assist her physically. Pt would benefit from continued skilled OT in order to maximize independence and safety with LB ADLs, toilet transfers ambulating to bathroom, and tub bench transfers.     Follow Up Recommendations  No OT follow up;Supervision - Intermittent    Equipment Recommendations  3 in 1 bedside comode;Other (comment) Lexicographer(reacher )    Recommendations for Other Services PT consult     Precautions / Restrictions Precautions Precautions: Fall Restrictions Weight Bearing Restrictions: Yes LLE Weight Bearing: Non weight bearing      Mobility Bed Mobility Overal bed mobility: Needs Assistance Bed Mobility: Supine to Sit     Supine to sit: Modified independent (Device/Increase time)     General bed mobility comments: HOB elevated  Transfers Overall transfer level: Needs assistance Equipment used: Rolling walker (2 wheeled) Transfers: Sit to/from UGI CorporationStand;Stand Pivot Transfers Sit to Stand: Supervision Stand pivot transfers: Supervision       General transfer comment: Supervision for safety. VC for hand placement, good technique. Sit to stand from EOB x 1. Stand pivot from EOB to chair x 1    Balance Overall balance assessment: Needs assistance         Standing balance support: Bilateral  upper extremity supported Standing balance-Leahy Scale: Fair Standing balance comment: RW for support                             ADL Overall ADL's : Needs assistance/impaired Eating/Feeding: Set up;Sitting   Grooming: Supervision/safety;Standing       Lower Body Bathing: Supervison/ safety;Sit to/from stand       Lower Body Dressing: Supervision/safety;Sit to/from stand Lower Body Dressing Details (indicate cue type and reason): Educated on compensatory strategies for LB ADLs; pt verbalized understanding. Provided pt with reacher for increased independence with LB ADLs and safety retrieving items from the floor. Toilet Transfer: English as a second language teacherupervision/safety;Stand-pivot;BSC;RW Toilet Transfer Details (indicate cue type and reason): Simulated toilet transfer with transfer from EOB to chair. Pt reports she stand pivot transferred to 3 in 1 this AM. Educated on use of 3 in 1 over toilet Toileting- ArchitectClothing Manipulation and Hygiene: Supervision/safety;Sit to/from stand       Functional mobility during ADLs: Supervision/safety;Rolling walker General ADL Comments: No family present for OT eval. Educated pt on edema management techniques, home safety; pt verbalized understanding. Pt able to recall NWB status on LLE and maintain during functional mobility.     Vision     Perception     Praxis      Pertinent Vitals/Pain Pain Assessment: 0-10 Pain Score: 2  Pain Location: LLE  Pain Descriptors / Indicators: Aching;Sore Pain Intervention(s): Limited activity within patient's tolerance;Monitored during session;Repositioned;Ice applied     Hand Dominance     Extremity/Trunk Assessment Upper Extremity Assessment Upper Extremity Assessment: Overall WFL for tasks assessed   Lower Extremity  Assessment Lower Extremity Assessment: Defer to PT evaluation   Cervical / Trunk Assessment Cervical / Trunk Assessment: Normal   Communication Communication Communication: No  difficulties   Cognition Arousal/Alertness: Awake/alert Behavior During Therapy: WFL for tasks assessed/performed Overall Cognitive Status: Within Functional Limits for tasks assessed                     General Comments       Exercises       Shoulder Instructions      Home Living Family/patient expects to be discharged to:: Private residence Living Arrangements: Other relatives (grandmother, niece, son ) Available Help at Discharge: Family (grandmother is present 24/7 but cannot assist physically) Type of Home: House Home Access: Stairs to enter Secretary/administrator of Steps: 7   Home Layout: One level     Bathroom Shower/Tub: Chief Strategy Officer: Standard Bathroom Accessibility: Yes How Accessible: Accessible via walker Home Equipment: Crutches;Tub bench          Prior Functioning/Environment Level of Independence: Independent with assistive device(s)        Comments: Has been using crutches to ambulate since injury ~2 weeks ago     OT Diagnosis: Acute pain   OT Problem List: Decreased activity tolerance;Impaired balance (sitting and/or standing);Decreased safety awareness;Decreased knowledge of use of DME or AE;Pain   OT Treatment/Interventions: Self-care/ADL training;DME and/or AE instruction;Patient/family education    OT Goals(Current goals can be found in the care plan section) Acute Rehab OT Goals Patient Stated Goal: to go home OT Goal Formulation: With patient Time For Goal Achievement: 08/22/15 Potential to Achieve Goals: Good ADL Goals Pt Will Perform Grooming: with modified independence;standing Pt Will Perform Lower Body Bathing: with modified independence;sit to/from stand Pt Will Perform Lower Body Dressing: with modified independence;sit to/from stand Pt Will Transfer to Toilet: with modified independence;ambulating;bedside commode (BSC over toilet) Pt Will Perform Tub/Shower Transfer: Tub transfer;with modified  independence;ambulating;tub bench (crutches )  OT Frequency: Min 2X/week   Barriers to D/C: Decreased caregiver support          Co-evaluation              End of Session Equipment Utilized During Treatment: Gait belt;Rolling walker Nurse Communication: Mobility status;Other (comment) (pt with slight nausea at end of session)  Activity Tolerance: Patient tolerated treatment well Patient left: in chair;with call bell/phone within reach   Time: 0823-0841 OT Time Calculation (min): 18 min Charges:  OT General Charges $OT Visit: 1 Procedure OT Evaluation $Initial OT Evaluation Tier I: 1 Procedure G-Codes: OT G-codes **NOT FOR INPATIENT CLASS** Functional Assessment Tool Used: Clinical judgement Functional Limitation: Self care Self Care Current Status (Z6109): At least 1 percent but less than 20 percent impaired, limited or restricted Self Care Goal Status (U0454): At least 1 percent but less than 20 percent impaired, limited or restricted   Gaye Alken M.S., OTR/L Pager: [phone]  08/08/2015, 8:59 AM

## 2015-08-08 NOTE — Progress Notes (Signed)
Nutrition Brief Note  Patient identified on the Malnutrition Screening Tool (MST) Report  Wt Readings from Last 15 Encounters:  08/07/15 185 lb (83.915 kg)  07/24/15 185 lb (83.915 kg)  09/12/14 170 lb (77.111 kg)  04/20/14 194 lb (87.998 kg)  01/24/14 206 lb (93.441 kg)  05/12/11 160 lb (72.576 kg)    Body mass index is 34.97 kg/(m^2). Patient meets criteria for obese based on current BMI.   Current diet order is npo, patient is consuming approximately 85% of meals at this time. Labs and medications reviewed.   No nutrition interventions warranted at this time. If nutrition issues arise, please consult RD.   Dionne AnoWilliam M. Kaneesha Constantino, MS, RD LDN After Hours/Weekend Pager [phone]
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
## 2015-08-08 NOTE — Progress Notes (Signed)
   Subjective:  Patient reports pain as mild.  Sleeping comfortably  Objective:   VITALS:   Filed Vitals:   08/07/15 1857 08/07/15 1910 08/07/15 2124 08/08/15 0539  BP:  110/65 134/85 127/82  Pulse:  86 89 90  Temp: 98.4 F (36.9 C) 97.9 F (36.6 C) 98.4 F (36.9 C) 98.7 F (37.1 C)  TempSrc:   Oral Oral  Resp:  17 18 18   Height:      Weight:      SpO2:  100% 100% 100%    Neurologically intact Neurovascular intact Sensation intact distally Intact pulses distally Dorsiflexion/Plantar flexion intact Incision: dressing C/D/I and no drainage No cellulitis present Compartment soft   Lab Results  Component Value Date   WBC 7.8 08/07/2015   HGB 11.4* 08/07/2015   HCT 36.3 08/07/2015   MCV 89.0 08/07/2015   PLT 422* 08/07/2015     Assessment/Plan:  1 Day Post-Op   - Up with PT/OT - DVT ppx - SCDs, ambulation, asa - NWB operative extremity - Pain controlled - Discharge planning - home today after PT  Cheral AlmasXu, Naiping Michael 08/08/2015, 7:51 AM [phone]

## 2015-08-08 NOTE — Care Management Note (Signed)
Case Management Note  Patient Details  Name: Duran BingChartaria S Barcenas MRN: [id number] Date of Birth: 06/13/1986  Subjective/Objective:  29 yr old female s/p ORIF of left pilon fracture                  Action/Plan:  DME ordered for patient , no Home Health needs identified.    Expected Discharge Date:    08/08/15             Expected Discharge Plan:  Home/Self Care  In-House Referral:  NA  Discharge planning Services  CM Consult  Post Acute Care Choice:  Durable Medical Equipment Choice offered to:     DME Arranged:  3-N-1 DME Agency:  [facility]  HH Arranged:  NA HH Agency:  NA  Status of Service:  Completed, signed off  Medicare Important Message Given:    Date Medicare IM Given:    Medicare IM give by:    Date Additional Medicare IM Given:    Additional Medicare Important Message give by:     If discussed at Long Length of Stay Meetings, dates discussed:    Additional Comments:  Durenda GuthrieBrady, Ephraim Reichel Naomi, RN 08/08/2015, 12:07 PM
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
## 2015-08-12 NOTE — Progress Notes (Signed)
Physical Therapy Progress note Addendum  from 08/08/15   08/08/15 1127  PT G-Codes **NOT FOR INPATIENT CLASS**  Functional Assessment Tool Used clinical judgement  Functional Limitation Mobility: Walking and moving around  Mobility: Walking and Moving Around Current Status [phone]([id number]) CI  Mobility: Walking and Moving Around Goal Status [phone]([id number]) CI  Delaney MeigsMaija Tabor Jorge Amparo, PT [phone]

## 2015-10-10 ENCOUNTER — Ambulatory Visit: Payer: Medicaid Other | Attending: Orthopaedic Surgery

## 2015-10-10 DIAGNOSIS — M25572 Pain in left ankle and joints of left foot: Secondary | ICD-10-CM | POA: Diagnosis not present

## 2015-10-10 DIAGNOSIS — M25672 Stiffness of left ankle, not elsewhere classified: Secondary | ICD-10-CM

## 2015-10-10 DIAGNOSIS — R262 Difficulty in walking, not elsewhere classified: Secondary | ICD-10-CM | POA: Diagnosis present

## 2015-10-10 DIAGNOSIS — R600 Localized edema: Secondary | ICD-10-CM | POA: Diagnosis present

## 2015-10-10 DIAGNOSIS — M259 Joint disorder, unspecified: Secondary | ICD-10-CM | POA: Diagnosis present

## 2015-10-10 DIAGNOSIS — R29898 Other symptoms and signs involving the musculoskeletal system: Secondary | ICD-10-CM

## 2015-10-10 NOTE — Patient Instructions (Signed)
Issued  Program for Active and passive ROM LT ankle and with belt  And manual. , towel exercises and isometrics , Also issued red band for strnegth exercises when ready and standing stretching when FWB. Also issued contrast bath instructions.   Stretches 30 sec as tolerated 2-3 reps 2x/day and isometrics 5 sec x 10-20 and band 10-20 as able
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
## 2015-10-29 ENCOUNTER — Ambulatory Visit: Payer: Medicaid Other | Admitting: Rehabilitative and Restorative Service Providers"

## 2015-10-29 DIAGNOSIS — M25672 Stiffness of left ankle, not elsewhere classified: Secondary | ICD-10-CM

## 2015-10-29 DIAGNOSIS — M25572 Pain in left ankle and joints of left foot: Secondary | ICD-10-CM | POA: Diagnosis not present

## 2015-10-29 DIAGNOSIS — R262 Difficulty in walking, not elsewhere classified: Secondary | ICD-10-CM

## 2015-10-29 DIAGNOSIS — R29898 Other symptoms and signs involving the musculoskeletal system: Secondary | ICD-10-CM

## 2015-10-29 DIAGNOSIS — R600 Localized edema: Secondary | ICD-10-CM

## 2015-10-29 NOTE — Patient Instructions (Signed)
HIP: Hamstrings - Supine    Place strap around foot. Raise leg up, keep knee straight. Hold _30_ seconds. _3__ reps per set, 2-3 times/day    Ankle Alphabet    Using left ankle and foot only, trace the letters of the alphabet. Perform A to Z. Repeat ____ times per set. Do ____ sets per session. Do ____ sessions per day.   Dorsiflexion: Self-Mobilization (Sitting)    Feet flat, other foot forward, slide left foot back until gentle stretch is felt. Keep entire foot on floor. Hold __5-10__ seconds. Relax. Repeat __10__ times per set. Do _1-3___ sets per session. Do __2-3__ sessions per day.    Toe Curl: Unilateral    With right foot resting on towel, slowly bunch up towel by curling toes. Repeat __10__ times per set. Do __1-3__ sets per session. Do _2-3___ sessions per day.  With theraband exercises try adding 10 fast reps  Inversion: Resisted   Cross legs with right leg underneath, foot in tubing loop. Hold tubing around other foot to resist and turn foot in. Repeat __10__ times per set. Do _1-3__ sets per session. Do __2__ sessions per day.  Eversion: Resisted   With right foot in tubing loop, hold tubing around other foot to resist and turn foot out. Do not move hip. Repeat 10 times per set. Do 1-3 sets per session. Do 2 sessions per day.    Plantar Flexion: Resisted   Anchor behind, tubing around left foot, press down. Repeat ___10_ times per set. Do __1-3__ sets per session. Do __2-3__ sessions per day.   Dorsiflexion: Resisted   Facing anchor, tubing around left foot, pull toward face.  Repeat _10___ times per set. Do __1-3__ sets per session. Do __2-3__ sessions per day.  Avoid locking knees  Feet Apart, Varied Arm Positions - Eyes Open    With eyes open, feet shoulder width apart, arms out, look straight ahead at a stationary object. Hold _2-3 minutes. Repeat _2-3___ times per session. Do _2-3___ sessions per day.   Stand with weight even on  both legs  Bend knees slightly hole 10 sec  Return to stand Repeat 10 times  2-3 times/day
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
## 2015-10-31 ENCOUNTER — Ambulatory Visit: Payer: Medicaid Other | Admitting: Physical Therapy

## 2015-11-05 ENCOUNTER — Ambulatory Visit: Payer: Medicaid Other | Admitting: Physical Therapy

## 2015-11-05 DIAGNOSIS — R262 Difficulty in walking, not elsewhere classified: Secondary | ICD-10-CM

## 2015-11-05 DIAGNOSIS — R600 Localized edema: Secondary | ICD-10-CM

## 2015-11-05 DIAGNOSIS — M25672 Stiffness of left ankle, not elsewhere classified: Secondary | ICD-10-CM

## 2015-11-05 DIAGNOSIS — M25572 Pain in left ankle and joints of left foot: Secondary | ICD-10-CM | POA: Diagnosis not present

## 2015-11-05 DIAGNOSIS — R29898 Other symptoms and signs involving the musculoskeletal system: Secondary | ICD-10-CM
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
## 2015-11-15 ENCOUNTER — Ambulatory Visit: Payer: Medicaid Other

## 2015-12-17 ENCOUNTER — Encounter: Payer: Self-pay | Admitting: Physical Medicine & Rehabilitation

## 2016-06-12 ENCOUNTER — Emergency Department (HOSPITAL_COMMUNITY)
Admission: EM | Admit: 2016-06-12 | Discharge: 2016-06-12 | Disposition: A | Discharge status: Home / Self Care | Payer: Medicaid Other | Attending: Emergency Medicine | Admitting: Emergency Medicine

## 2016-06-12 ENCOUNTER — Encounter (HOSPITAL_COMMUNITY): Payer: Self-pay | Admitting: Vascular Surgery

## 2016-06-12 DIAGNOSIS — N72 Inflammatory disease of cervix uteri: Secondary | ICD-10-CM | POA: Insufficient documentation

## 2016-06-12 DIAGNOSIS — Z87891 Personal history of nicotine dependence: Secondary | ICD-10-CM | POA: Diagnosis not present

## 2016-06-12 DIAGNOSIS — Z202 Contact with and (suspected) exposure to infections with a predominantly sexual mode of transmission: Secondary | ICD-10-CM | POA: Diagnosis not present

## 2016-06-12 DIAGNOSIS — Z711 Person with feared health complaint in whom no diagnosis is made: Secondary | ICD-10-CM

## 2016-06-12 DIAGNOSIS — N898 Other specified noninflammatory disorders of vagina: Secondary | ICD-10-CM | POA: Diagnosis present

## 2016-06-12 DIAGNOSIS — Z7982 Long term (current) use of aspirin: Secondary | ICD-10-CM | POA: Diagnosis not present

## 2016-06-12 LAB — WET PREP, GENITAL
Sperm: NONE SEEN
Trich, Wet Prep: NONE SEEN
Yeast Wet Prep HPF POC: NONE SEEN

## 2016-06-12 MED ORDER — CEFTRIAXONE SODIUM 250 MG IJ SOLR
250.0000 mg | Freq: Once | INTRAMUSCULAR | Status: AC
  Administered 2016-06-12: 250 mg via INTRAMUSCULAR
  Filled 2016-06-12: qty 250

## 2016-06-12 MED ORDER — AZITHROMYCIN 250 MG PO TABS
1000.0000 mg | ORAL_TABLET | Freq: Once | ORAL | Status: AC
  Administered 2016-06-12: 1000 mg via ORAL
  Filled 2016-06-12: qty 4

## 2016-06-12 MED ORDER — METRONIDAZOLE 500 MG PO TABS
2000.0000 mg | ORAL_TABLET | Freq: Once | ORAL | Status: AC
  Administered 2016-06-12: 2000 mg via ORAL
  Filled 2016-06-12: qty 4

## 2016-06-12 NOTE — ED Triage Notes (Signed)
Pt reports to the ED for eval of white vaginal d/c with an odor. Pt reports she did have some recent unprotected sex. D/c x several weeks. Pt denies any abd pain or N/V. Pt denies any vaginal bleeding as well.
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
## 2016-06-12 NOTE — Discharge Instructions (Signed)
No sexual contact for 7 days.  Return here as needed

## 2016-06-15 LAB — GC/CHLAMYDIA PROBE AMP (~~LOC~~) NOT AT ARMC
Chlamydia: POSITIVE — AB
Neisseria Gonorrhea: NEGATIVE

## 2016-06-16 ENCOUNTER — Telehealth (HOSPITAL_BASED_OUTPATIENT_CLINIC_OR_DEPARTMENT_OTHER): Payer: Self-pay | Admitting: Emergency Medicine

## 2016-06-17 ENCOUNTER — Telehealth (HOSPITAL_BASED_OUTPATIENT_CLINIC_OR_DEPARTMENT_OTHER): Payer: Self-pay | Admitting: Emergency Medicine

## 2016-06-29 ENCOUNTER — Telehealth (INDEPENDENT_AMBULATORY_CARE_PROVIDER_SITE_OTHER): Payer: Self-pay | Admitting: Orthopedic Surgery

## 2016-06-29 DIAGNOSIS — S82872D Displaced pilon fracture of left tibia, subsequent encounter for closed fracture with routine healing: Secondary | ICD-10-CM

## 2016-06-29 NOTE — Telephone Encounter (Signed)
Nicole Manning states Dr. Roda ShuttersXu referred her for physical therapy on her ankle and the facility he suggested does not take Medicaid. Can we refer to a different facility?

## 2016-06-30 NOTE — Telephone Encounter (Signed)
[facility] of [facility] then

## 2016-07-24 ENCOUNTER — Telehealth (INDEPENDENT_AMBULATORY_CARE_PROVIDER_SITE_OTHER): Payer: Self-pay | Admitting: Orthopaedic Surgery

## 2016-07-24 NOTE — Telephone Encounter (Signed)
Please advise 

## 2016-07-24 NOTE — Telephone Encounter (Signed)
PATIENT IS REQUESTING A RX REFILL FOR LEFT ANKLE PAIN.  [facility] [address]  Cb#: [phone]

## 2016-07-24 NOTE — Telephone Encounter (Signed)
I can do tramadol.  If she needs stronger, she has to see a PCP.

## 2016-07-27 MED ORDER — TRAMADOL HCL 50 MG PO TABS: 50.0000 mg | ORAL_TABLET | Freq: Two times a day (BID) | ORAL | 3 refills | Status: DC

## 2016-07-27 NOTE — Telephone Encounter (Signed)
rx called into pharm.

## 2016-08-05 ENCOUNTER — Encounter: Payer: Medicaid Other | Admitting: Family Medicine

## 2016-08-13 ENCOUNTER — Telehealth: Payer: Self-pay | Admitting: Physical Therapy

## 2016-08-13 NOTE — Telephone Encounter (Signed)
08/13/16 has already had 3 PT visits @ [address] authorized by [employer]

## 2016-10-30 ENCOUNTER — Telehealth (INDEPENDENT_AMBULATORY_CARE_PROVIDER_SITE_OTHER): Payer: Self-pay | Admitting: Orthopaedic Surgery

## 2016-10-30 NOTE — Telephone Encounter (Signed)
Patient requesting Rx pain med.  She is asking for something stronger than Tramadol.

## 2016-11-01 ENCOUNTER — Emergency Department (HOSPITAL_COMMUNITY)
Admission: EM | Admit: 2016-11-01 | Discharge: 2016-11-01 | Disposition: A | Discharge status: Home / Self Care | Payer: Medicaid Other | Attending: Emergency Medicine | Admitting: Emergency Medicine

## 2016-11-01 ENCOUNTER — Encounter (HOSPITAL_COMMUNITY): Payer: Self-pay

## 2016-11-01 DIAGNOSIS — N760 Acute vaginitis: Secondary | ICD-10-CM | POA: Insufficient documentation

## 2016-11-01 DIAGNOSIS — Z7982 Long term (current) use of aspirin: Secondary | ICD-10-CM | POA: Insufficient documentation

## 2016-11-01 DIAGNOSIS — B9689 Other specified bacterial agents as the cause of diseases classified elsewhere: Secondary | ICD-10-CM

## 2016-11-01 DIAGNOSIS — A5901 Trichomonal vulvovaginitis: Secondary | ICD-10-CM | POA: Insufficient documentation

## 2016-11-01 DIAGNOSIS — Z87891 Personal history of nicotine dependence: Secondary | ICD-10-CM | POA: Insufficient documentation

## 2016-11-01 DIAGNOSIS — N898 Other specified noninflammatory disorders of vagina: Secondary | ICD-10-CM | POA: Diagnosis present

## 2016-11-01 LAB — URINALYSIS, ROUTINE W REFLEX MICROSCOPIC
BACTERIA UA: NONE SEEN
BILIRUBIN URINE: NEGATIVE
Glucose, UA: NEGATIVE mg/dL
Hgb urine dipstick: NEGATIVE
Ketones, ur: NEGATIVE mg/dL
NITRITE: NEGATIVE
PH: 5 (ref 5.0–8.0)
Protein, ur: NEGATIVE mg/dL
Specific Gravity, Urine: 1.016 (ref 1.005–1.030)

## 2016-11-01 LAB — WET PREP, GENITAL
SPERM: NONE SEEN
YEAST WET PREP: NONE SEEN

## 2016-11-01 LAB — PREGNANCY, URINE: PREG TEST UR: NEGATIVE

## 2016-11-01 MED ORDER — CEFTRIAXONE SODIUM 250 MG IJ SOLR
250.0000 mg | Freq: Once | INTRAMUSCULAR | Status: AC
  Administered 2016-11-01: 250 mg via INTRAMUSCULAR
  Filled 2016-11-01: qty 250

## 2016-11-01 MED ORDER — METRONIDAZOLE 500 MG PO TABS: 500.0000 mg | ORAL_TABLET | Freq: Two times a day (BID) | ORAL | 0 refills | Status: DC

## 2016-11-01 MED ORDER — AZITHROMYCIN 250 MG PO TABS
1000.0000 mg | ORAL_TABLET | Freq: Once | ORAL | Status: AC
  Administered 2016-11-01: 1000 mg via ORAL
  Filled 2016-11-01: qty 4

## 2016-11-01 MED ORDER — LIDOCAINE HCL (PF) 1 % IJ SOLN
5.0000 mL | Freq: Once | INTRAMUSCULAR | Status: AC
  Administered 2016-11-01: 5 mL
  Filled 2016-11-01: qty 5

## 2016-11-01 MED ORDER — ACETAMINOPHEN 500 MG PO TABS
1000.0000 mg | ORAL_TABLET | Freq: Once | ORAL | Status: AC
  Administered 2016-11-01: 1000 mg via ORAL
  Filled 2016-11-01: qty 2

## 2016-11-01 NOTE — Discharge Instructions (Signed)
It was our pleasure to provide your ER care today - we hope that you feel better.  Take antibiotic as prescribed.  Do not drink any alcohol when taking this medication.  Take tylenol and/or motrin as need.  Follow up with primary care doctor in 1 week if symptoms fail to improve/resolve.  Return to ER if worse, worsening or severe abdominal pain, high fevers, persistent vomiting, other concern.

## 2016-11-01 NOTE — ED Triage Notes (Signed)
Patient complains of 1 week of vaginal itching and vaginal discharge, denies dysuria, states lower abdominal pressure with same
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
## 2016-11-02 LAB — GC/CHLAMYDIA PROBE AMP (~~LOC~~) NOT AT ARMC
CHLAMYDIA, DNA PROBE: NEGATIVE
Neisseria Gonorrhea: NEGATIVE

## 2016-11-02 NOTE — Telephone Encounter (Signed)
Tylenol 3. #30

## 2016-11-02 NOTE — Telephone Encounter (Signed)
Please advise 

## 2016-11-04 MED ORDER — ACETAMINOPHEN-CODEINE #3 300-30 MG PO TABS
1.0000 | ORAL_TABLET | Freq: Every day | ORAL | 0 refills | Status: DC
Start: 2016-11-04 — End: 2016-11-30

## 2016-11-04 NOTE — Telephone Encounter (Signed)
Pending signature

## 2016-11-04 NOTE — Addendum Note (Signed)
Addended by: Albertina ParrGARCIA, Nazifa Trinka on: 11/04/2016 09:40 AM   Modules accepted: Orders

## 2016-11-26 ENCOUNTER — Telehealth (INDEPENDENT_AMBULATORY_CARE_PROVIDER_SITE_OTHER): Payer: Self-pay | Admitting: Orthopaedic Surgery

## 2016-11-26 NOTE — Telephone Encounter (Signed)
30

## 2016-11-26 NOTE — Telephone Encounter (Signed)
See message below °

## 2016-11-26 NOTE — Telephone Encounter (Signed)
Please advise. Thank you

## 2016-11-26 NOTE — Telephone Encounter (Signed)
Patient called needing Rx refilled (Tylenol #3) Patient asked if she can get anything stronger? The number to contact patient is [phone]

## 2016-11-27 ENCOUNTER — Telehealth (INDEPENDENT_AMBULATORY_CARE_PROVIDER_SITE_OTHER): Payer: Self-pay | Admitting: *Deleted

## 2016-11-27 NOTE — Telephone Encounter (Signed)
Pt called back about tylenol refill  

## 2016-11-30 MED ORDER — ACETAMINOPHEN-CODEINE #3 300-30 MG PO TABS: 1.0000 | ORAL_TABLET | Freq: Every day | ORAL | 0 refills | Status: DC

## 2016-11-30 NOTE — Telephone Encounter (Signed)
Called pt to let her know rx was called into pharm  should be ready for pick up later. Pt aware

## 2016-12-09 ENCOUNTER — Telehealth (INDEPENDENT_AMBULATORY_CARE_PROVIDER_SITE_OTHER): Payer: Self-pay | Admitting: *Deleted

## 2016-12-09 NOTE — Telephone Encounter (Signed)
Called Rx into correct pharm. Pt aware

## 2016-12-09 NOTE — Telephone Encounter (Signed)
[facility] pharmacy called in this morning in regards to needing to transfer the prescription of Tylenol #3 please [facility] [place], and it needs to go to [facility] on [address]. Thank you CB # [phone].

## 2017-01-05 ENCOUNTER — Telehealth (INDEPENDENT_AMBULATORY_CARE_PROVIDER_SITE_OTHER): Payer: Self-pay | Admitting: Orthopaedic Surgery

## 2017-01-05 NOTE — Telephone Encounter (Signed)
30

## 2017-01-05 NOTE — Telephone Encounter (Signed)
Patient called needing Rx refilled (Tylenol #3) The number to contact patient is [phone]

## 2017-01-05 NOTE — Telephone Encounter (Signed)
Please advise 

## 2017-01-06 ENCOUNTER — Other Ambulatory Visit (INDEPENDENT_AMBULATORY_CARE_PROVIDER_SITE_OTHER): Payer: Self-pay

## 2017-01-06 MED ORDER — TRAMADOL HCL 50 MG PO TABS: 50.0000 mg | ORAL_TABLET | Freq: Two times a day (BID) | ORAL | 0 refills | Status: DC

## 2017-01-06 NOTE — Telephone Encounter (Signed)
Called Rx into pharm pt aware.   

## 2017-01-07 ENCOUNTER — Telehealth (INDEPENDENT_AMBULATORY_CARE_PROVIDER_SITE_OTHER): Payer: Self-pay

## 2017-01-07 ENCOUNTER — Other Ambulatory Visit (INDEPENDENT_AMBULATORY_CARE_PROVIDER_SITE_OTHER): Payer: Self-pay

## 2017-01-07 MED ORDER — ACETAMINOPHEN-CODEINE #3 300-30 MG PO TABS: 1.0000 | ORAL_TABLET | Freq: Every day | ORAL | 0 refills | Status: DC

## 2017-01-07 NOTE — Telephone Encounter (Signed)
rx printed pt will come pick up

## 2017-01-07 NOTE — Addendum Note (Signed)
Addended by: Albertina ParrGARCIA, Tyse Auriemma on: 01/07/2017 11:17 AM   Modules accepted: Orders

## 2017-01-07 NOTE — Telephone Encounter (Signed)
Patient called stating that Rx was not sent to correct pharmacy. Patient stated she needed a Rx refill for Tylenol #3, [facility] [address]  CB# is [phone]. Please Advise. Thank You

## 2017-02-10 ENCOUNTER — Telehealth (INDEPENDENT_AMBULATORY_CARE_PROVIDER_SITE_OTHER): Payer: Self-pay | Admitting: Orthopaedic Surgery

## 2017-02-10 NOTE — Telephone Encounter (Signed)
30

## 2017-02-10 NOTE — Telephone Encounter (Signed)
Patient called needing Rx refilled (Tylenol #3) The number to contact patient is [phone]   °

## 2017-02-10 NOTE — Telephone Encounter (Signed)
SEE MESSAGE BELOW:

## 2017-02-11 MED ORDER — ACETAMINOPHEN-CODEINE #3 300-30 MG PO TABS: 1.0000 | ORAL_TABLET | Freq: Every day | ORAL | 0 refills | Status: DC

## 2017-02-11 NOTE — Telephone Encounter (Signed)
Called Rx into pharmacy.  

## 2017-02-11 NOTE — Addendum Note (Signed)
Addended by: Albertina ParrGARCIA, Christa Fasig on: 02/11/2017 09:07 AM   Modules accepted: Orders

## 2017-03-08 ENCOUNTER — Other Ambulatory Visit (INDEPENDENT_AMBULATORY_CARE_PROVIDER_SITE_OTHER): Payer: Self-pay

## 2017-03-08 NOTE — Telephone Encounter (Signed)
Would like a Rx refill for Tylenol #3. Cb# is [phone].  Please advise. Thank You.

## 2017-03-09 MED ORDER — ACETAMINOPHEN-CODEINE #3 300-30 MG PO TABS: 1.0000 | ORAL_TABLET | Freq: Every day | ORAL | 0 refills | Status: DC

## 2017-03-09 NOTE — Addendum Note (Signed)
Addended by: Penne LashSHUE WILLS, Otis DialsHRISTY N on: 03/09/2017 04:33 PM   Modules accepted: Orders

## 2017-03-09 NOTE — Telephone Encounter (Signed)
[facility] [address], pt is aware

## 2017-03-09 NOTE — Telephone Encounter (Signed)
Yes

## 2017-03-09 NOTE — Addendum Note (Signed)
Addended by: Penne LashSHUE WILLS, Otis DialsHRISTY N on: 03/09/2017 05:12 PM   Modules accepted: Orders

## 2017-04-08 ENCOUNTER — Telehealth (INDEPENDENT_AMBULATORY_CARE_PROVIDER_SITE_OTHER): Payer: Self-pay

## 2017-04-08 MED ORDER — ACETAMINOPHEN-CODEINE #3 300-30 MG PO TABS: 1.0000 | ORAL_TABLET | Freq: Every day | ORAL | 0 refills | Status: DC

## 2017-04-08 NOTE — Addendum Note (Signed)
Addended by: Albertina ParrGARCIA, Luciana Cammarata on: 04/08/2017 05:14 PM   Modules accepted: Orders

## 2017-04-08 NOTE — Telephone Encounter (Signed)
Called Rx into pharm. [facility]

## 2017-04-08 NOTE — Telephone Encounter (Signed)
30

## 2017-04-08 NOTE — Telephone Encounter (Signed)
Please advise 

## 2017-04-08 NOTE — Telephone Encounter (Signed)
Patient would like a Rx refill on Tylenol #3.  Cb# is [phone].  Please advise.  Thank You.

## 2017-05-05 ENCOUNTER — Other Ambulatory Visit (INDEPENDENT_AMBULATORY_CARE_PROVIDER_SITE_OTHER): Payer: Self-pay | Admitting: Radiology

## 2017-05-05 NOTE — Telephone Encounter (Signed)
Patient is calling to get a refill on the Tylenol #3.

## 2017-05-05 NOTE — Telephone Encounter (Signed)
Please advise 

## 2017-05-06 MED ORDER — ACETAMINOPHEN-CODEINE #3 300-30 MG PO TABS: 1.0000 | ORAL_TABLET | Freq: Every day | ORAL | 0 refills | Status: DC

## 2017-05-07 ENCOUNTER — Other Ambulatory Visit (INDEPENDENT_AMBULATORY_CARE_PROVIDER_SITE_OTHER): Payer: Self-pay | Admitting: Orthopaedic Surgery

## 2017-05-11 ENCOUNTER — Telehealth (INDEPENDENT_AMBULATORY_CARE_PROVIDER_SITE_OTHER): Payer: Self-pay | Admitting: Orthopaedic Surgery

## 2017-05-11 NOTE — Telephone Encounter (Signed)
Patient called asking for a refill on tylenol 3s. CB # [phone]

## 2017-05-12 ENCOUNTER — Ambulatory Visit (HOSPITAL_COMMUNITY)
Admission: EM | Admit: 2017-05-12 | Discharge: 2017-05-12 | Disposition: A | Discharge status: Home / Self Care | Payer: Medicaid Other | Attending: Family Medicine | Admitting: Family Medicine

## 2017-05-12 ENCOUNTER — Encounter (HOSPITAL_COMMUNITY): Payer: Self-pay | Admitting: *Deleted

## 2017-05-12 DIAGNOSIS — J069 Acute upper respiratory infection, unspecified: Secondary | ICD-10-CM | POA: Diagnosis not present

## 2017-05-12 DIAGNOSIS — J029 Acute pharyngitis, unspecified: Secondary | ICD-10-CM | POA: Diagnosis not present

## 2017-05-12 DIAGNOSIS — R05 Cough: Secondary | ICD-10-CM

## 2017-05-12 MED ORDER — BENZONATATE 100 MG PO CAPS: 200.0000 mg | ORAL_CAPSULE | Freq: Three times a day (TID) | ORAL | 0 refills | Status: DC | PRN

## 2017-05-12 NOTE — Discharge Instructions (Signed)
It was nice meeting you today Nicole Manning!  I have perscribed Tessalon perles for your cough. You can take one of these up to every 8 hours as needed. You can also have a teaspoonful of honey, either alone or mixed into a warm beverages, a few times a day to help with cough.   To help with your congestion, you can buy Delsym at the drugstore. Please take this as directed on the packaging. For sinus pressure and headaches, you can take ibuprofen or Tylenol.   Be sure to drink plenty of water so you do not get dehydrated.   Be well,  Dr. Natale MilchL[place]
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
## 2017-05-12 NOTE — ED Triage Notes (Signed)
Patient reports nasal congestion and productive cough x 1 week. Denies fevers.

## 2017-05-12 NOTE — Telephone Encounter (Signed)
alled into pharm per Dr  Roda ShuttersXu he approved it

## 2017-05-12 NOTE — Telephone Encounter (Signed)
Called Rx into pharm, patient aware.

## 2017-05-17 ENCOUNTER — Ambulatory Visit (INDEPENDENT_AMBULATORY_CARE_PROVIDER_SITE_OTHER): Payer: Self-pay | Admitting: Orthopaedic Surgery

## 2017-05-19 ENCOUNTER — Emergency Department (HOSPITAL_COMMUNITY)
Admission: EM | Admit: 2017-05-19 | Discharge: 2017-05-20 | Disposition: A | Discharge status: Other Acute Inpatient Hospital | Payer: Medicaid Other | Attending: Emergency Medicine | Admitting: Emergency Medicine

## 2017-05-19 ENCOUNTER — Emergency Department (HOSPITAL_COMMUNITY): Payer: Medicaid Other

## 2017-05-19 ENCOUNTER — Encounter (HOSPITAL_COMMUNITY): Payer: Self-pay | Admitting: Emergency Medicine

## 2017-05-19 DIAGNOSIS — Z79899 Other long term (current) drug therapy: Secondary | ICD-10-CM | POA: Diagnosis not present

## 2017-05-19 DIAGNOSIS — R440 Auditory hallucinations: Secondary | ICD-10-CM

## 2017-05-19 DIAGNOSIS — Z87891 Personal history of nicotine dependence: Secondary | ICD-10-CM | POA: Insufficient documentation

## 2017-05-19 DIAGNOSIS — R079 Chest pain, unspecified: Secondary | ICD-10-CM | POA: Diagnosis not present

## 2017-05-19 DIAGNOSIS — R519 Headache, unspecified: Secondary | ICD-10-CM

## 2017-05-19 DIAGNOSIS — R42 Dizziness and giddiness: Secondary | ICD-10-CM | POA: Diagnosis not present

## 2017-05-19 DIAGNOSIS — R7989 Other specified abnormal findings of blood chemistry: Secondary | ICD-10-CM | POA: Insufficient documentation

## 2017-05-19 DIAGNOSIS — R51 Headache: Secondary | ICD-10-CM | POA: Insufficient documentation

## 2017-05-19 LAB — COMPREHENSIVE METABOLIC PANEL
ALBUMIN: 3.8 g/dL (ref 3.5–5.0)
ALT: 22 U/L (ref 14–54)
ANION GAP: 7 (ref 5–15)
AST: 20 U/L (ref 15–41)
Alkaline Phosphatase: 59 U/L (ref 38–126)
BILIRUBIN TOTAL: 0.3 mg/dL (ref 0.3–1.2)
BUN: 11 mg/dL (ref 6–20)
CALCIUM: 9.1 mg/dL (ref 8.9–10.3)
CO2: 26 mmol/L (ref 22–32)
Chloride: 103 mmol/L (ref 101–111)
Creatinine, Ser: 1.3 mg/dL — ABNORMAL HIGH (ref 0.44–1.00)
GFR calc Af Amer: >60 mL/min (ref >60)
GFR, EST NON AFRICAN AMERICAN: 54 mL/min — AB (ref >60)
GLUCOSE: 113 mg/dL — AB (ref 65–99)
POTASSIUM: 3.9 mmol/L (ref 3.5–5.1)
SODIUM: 136 mmol/L (ref 135–145)
TOTAL PROTEIN: 7.3 g/dL (ref 6.5–8.1)

## 2017-05-19 LAB — CBC WITH DIFFERENTIAL/PLATELET
BASOS ABS: 0 10*3/uL (ref 0.0–0.1)
Basophils Relative: 0 %
Eosinophils Absolute: 0.2 10*3/uL (ref 0.0–0.7)
Eosinophils Relative: 2 %
HEMATOCRIT: 38.9 % (ref 36.0–46.0)
Hemoglobin: 12.3 g/dL (ref 12.0–15.0)
LYMPHS ABS: 3.2 10*3/uL (ref 0.7–4.0)
LYMPHS PCT: 36 %
MCH: 28.6 pg (ref 26.0–34.0)
MCHC: 31.6 g/dL (ref 30.0–36.0)
MCV: 90.5 fL (ref 78.0–100.0)
MONO ABS: 1 10*3/uL (ref 0.1–1.0)
Monocytes Relative: 11 %
NEUTROS ABS: 4.5 10*3/uL (ref 1.7–7.7)
Neutrophils Relative %: 51 %
Platelets: 316 10*3/uL (ref 150–400)
RBC: 4.3 MIL/uL (ref 3.87–5.11)
RDW: 13.2 % (ref 11.5–15.5)
WBC: 8.8 10*3/uL (ref 4.0–10.5)

## 2017-05-19 LAB — RAPID URINE DRUG SCREEN, HOSP PERFORMED
Amphetamines: NOT DETECTED
BARBITURATES: NOT DETECTED
BENZODIAZEPINES: NOT DETECTED
COCAINE: POSITIVE — AB
Opiates: POSITIVE — AB
Tetrahydrocannabinol: POSITIVE — AB

## 2017-05-19 LAB — ETHANOL

## 2017-05-19 LAB — I-STAT BETA HCG BLOOD, ED (MC, WL, AP ONLY): I-stat hCG, quantitative: <5 m[IU]/mL (ref <5)

## 2017-05-19 MED ORDER — ZOLPIDEM TARTRATE 5 MG PO TABS: 5.0000 mg | ORAL_TABLET | Freq: Every evening | ORAL | Status: DC | PRN

## 2017-05-19 MED ORDER — NICOTINE 21 MG/24HR TD PT24: 21.0000 mg | MEDICATED_PATCH | Freq: Every day | TRANSDERMAL | Status: DC

## 2017-05-19 MED ORDER — SODIUM CHLORIDE 0.9 % IV BOLUS (SEPSIS)
1000.0000 mL | Freq: Once | INTRAVENOUS | Status: AC
  Administered 2017-05-19: 1000 mL via INTRAVENOUS

## 2017-05-19 MED ORDER — ACETAMINOPHEN 325 MG PO TABS: 650.0000 mg | ORAL_TABLET | ORAL | Status: DC | PRN

## 2017-05-19 MED ORDER — KETOROLAC TROMETHAMINE 30 MG/ML IJ SOLN
30.0000 mg | Freq: Once | INTRAMUSCULAR | Status: AC
  Administered 2017-05-19: 30 mg via INTRAVENOUS
  Filled 2017-05-19: qty 1
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
## 2017-05-19 NOTE — ED Notes (Signed)
Patient transported to CT 

## 2017-05-19 NOTE — ED Notes (Signed)
Pt states " can I request a XR or MRI?" and also reports she needs a note for work for missing yesterday and today.
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
## 2017-05-19 NOTE — ED Notes (Signed)
TTS at bedside. 

## 2017-05-19 NOTE — ED Notes (Signed)
ED Provider at bedside. 
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
## 2017-05-20 ENCOUNTER — Encounter (HOSPITAL_COMMUNITY): Payer: Self-pay

## 2017-05-20 ENCOUNTER — Inpatient Hospital Stay (HOSPITAL_COMMUNITY)
Admission: EM | Admit: 2017-05-20 | Discharge: 2017-05-24 | DRG: 885 | Disposition: A | Discharge status: Home / Self Care | Payer: Medicaid Other | Source: Intra-hospital | Attending: Psychiatry | Admitting: Psychiatry

## 2017-05-20 ENCOUNTER — Ambulatory Visit (INDEPENDENT_AMBULATORY_CARE_PROVIDER_SITE_OTHER): Payer: Medicaid Other | Admitting: Orthopaedic Surgery

## 2017-05-20 DIAGNOSIS — F200 Paranoid schizophrenia: Secondary | ICD-10-CM | POA: Diagnosis present

## 2017-05-20 DIAGNOSIS — F1221 Cannabis dependence, in remission: Secondary | ICD-10-CM | POA: Diagnosis not present

## 2017-05-20 DIAGNOSIS — Z915 Personal history of self-harm: Secondary | ICD-10-CM

## 2017-05-20 DIAGNOSIS — Z87891 Personal history of nicotine dependence: Secondary | ICD-10-CM | POA: Diagnosis not present

## 2017-05-20 DIAGNOSIS — Z7982 Long term (current) use of aspirin: Secondary | ICD-10-CM | POA: Diagnosis not present

## 2017-05-20 DIAGNOSIS — F1994 Other psychoactive substance use, unspecified with psychoactive substance-induced mood disorder: Secondary | ICD-10-CM | POA: Diagnosis not present

## 2017-05-20 DIAGNOSIS — Z818 Family history of other mental and behavioral disorders: Secondary | ICD-10-CM

## 2017-05-20 DIAGNOSIS — F1421 Cocaine dependence, in remission: Secondary | ICD-10-CM | POA: Diagnosis not present

## 2017-05-20 DIAGNOSIS — Z79899 Other long term (current) drug therapy: Secondary | ICD-10-CM | POA: Diagnosis not present

## 2017-05-20 DIAGNOSIS — F19959 Other psychoactive substance use, unspecified with psychoactive substance-induced psychotic disorder, unspecified: Secondary | ICD-10-CM | POA: Diagnosis present

## 2017-05-20 MED ORDER — HYDROXYZINE HCL 25 MG PO TABS: 25.0000 mg | ORAL_TABLET | Freq: Four times a day (QID) | ORAL | Status: DC | PRN

## 2017-05-20 MED ORDER — LORATADINE 10 MG PO TABS
10.0000 mg | ORAL_TABLET | Freq: Every day | ORAL | Status: DC | PRN
  Administered 2017-05-22 – 2017-05-23 (×2): 10 mg via ORAL
  Filled 2017-05-20 (×2): qty 1

## 2017-05-20 MED ORDER — QUETIAPINE FUMARATE ER 50 MG PO TB24
50.0000 mg | ORAL_TABLET | Freq: Every day | ORAL | Status: DC
  Administered 2017-05-20: 50 mg via ORAL
  Filled 2017-05-20 (×4): qty 1

## 2017-05-20 MED ORDER — HALOPERIDOL 5 MG PO TABS
5.0000 mg | ORAL_TABLET | Freq: Two times a day (BID) | ORAL | Status: DC
  Administered 2017-05-20 – 2017-05-21 (×3): 5 mg via ORAL
  Filled 2017-05-20 (×6): qty 1

## 2017-05-20 MED ORDER — ACETAMINOPHEN-CODEINE #3 300-30 MG PO TABS
1.0000 | ORAL_TABLET | Freq: Every day | ORAL | Status: DC
  Administered 2017-05-20 – 2017-05-24 (×5): 1 via ORAL
  Filled 2017-05-20 (×5): qty 1

## 2017-05-20 MED ORDER — ACETAMINOPHEN 325 MG PO TABS: 650.0000 mg | ORAL_TABLET | Freq: Four times a day (QID) | ORAL | Status: DC | PRN

## 2017-05-20 MED ORDER — ALUM & MAG HYDROXIDE-SIMETH 200-200-20 MG/5ML PO SUSP: 30.0000 mL | ORAL | Status: DC | PRN

## 2017-05-20 MED ORDER — GABAPENTIN 300 MG PO CAPS
300.0000 mg | ORAL_CAPSULE | Freq: Three times a day (TID) | ORAL | Status: DC
  Administered 2017-05-20 – 2017-05-24 (×12): 300 mg via ORAL
  Filled 2017-05-20 (×17): qty 1

## 2017-05-20 MED ORDER — SENNOSIDES-DOCUSATE SODIUM 8.6-50 MG PO TABS: 1.0000 | ORAL_TABLET | Freq: Every evening | ORAL | Status: DC | PRN

## 2017-05-20 MED ORDER — ASPIRIN EC 325 MG PO TBEC
325.0000 mg | DELAYED_RELEASE_TABLET | Freq: Two times a day (BID) | ORAL | Status: DC
  Administered 2017-05-20 – 2017-05-24 (×9): 325 mg via ORAL
  Filled 2017-05-20 (×17): qty 1

## 2017-05-20 MED ORDER — MAGNESIUM HYDROXIDE 400 MG/5ML PO SUSP: 30.0000 mL | Freq: Every day | ORAL | Status: DC | PRN

## 2017-05-20 MED ORDER — TRAZODONE HCL 50 MG PO TABS
50.0000 mg | ORAL_TABLET | Freq: Every evening | ORAL | Status: DC | PRN
  Administered 2017-05-21 – 2017-05-23 (×5): 50 mg via ORAL
  Filled 2017-05-20 (×10): qty 1

## 2017-05-20 NOTE — BHH Counselor (Signed)
Per [name], PA-C: Patient meets criteria for inpatient treatment.  [facility], Tori, RN, Patient accepted.  Room #: 308-65305-02 Accepting Provider: [name], PA-C Attending Provider: [name], MD Nursing Report: [phone] Patient can come at anytime.  Attending Provider, Aviva KluverAlyssa Murray, PA-C, and [name], RN, notified at [phone].

## 2017-05-20 NOTE — Plan of Care (Signed)
Problem: Activity: Goal: Sleeping patterns will improve Outcome: Not Progressing Per report, pt slept 2.5 hrs last night.
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
## 2017-05-20 NOTE — Tx Team (Addendum)
Initial Treatment Plan 05/20/2017 3:16 AM [place] Binghartaria S Snee [id number]    PATIENT STRESSORS: Medication change or noncompliance Substance abuse   PATIENT STRENGTHS: Ability for insight Average or above average intelligence Capable of independent living Astronomerinancial means General fund of knowledge Motivation for treatment/growth Supportive family/friends   PATIENT IDENTIFIED PROBLEMS: "get these voices under control"  "get better for my son"  Substance Abuse  Psychosis               DISCHARGE CRITERIA:  Ability to meet basic life and health needs Adequate post-discharge living arrangements Improved stabilization in mood, thinking, and/or behavior Medical problems require only outpatient monitoring Motivation to continue treatment in a less acute level of care Need for constant or close observation no longer present Reduction of life-threatening or endangering symptoms to within safe limits Safe-care adequate arrangements made Verbal commitment to aftercare and medication compliance Withdrawal symptoms are absent or subacute and managed without 24-hour nursing intervention  PRELIMINARY DISCHARGE PLAN: Outpatient therapy  PATIENT/FAMILY INVOLVEMENT: This treatment plan has been presented to and reviewed with the patient, Darwin Binghartaria S Villavicencio.  The patient and family have been given the opportunity to ask questions and make suggestions.  Ferrel LoganAmanda A Durwood Dittus, RN 05/20/2017, 3:16 AM

## 2017-05-20 NOTE — Progress Notes (Signed)
Psychoeducational Group Note  Date:  05/20/2017 Time: 2045   Group Topic/Focus:  wrap up group  Participation Level: Did Not Attend  Participation Quality:  Not Applicable  Affect:  Not Applicable  Cognitive:  Not Applicable  Insight:  Not Applicable  Engagement in Group: Not Applicable  Additional Comments:Pt was sleeping during group time.   Marcille BuffyMcNeil, Lovell Nuttall S 05/20/2017, 10:00 PM
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
## 2017-05-20 NOTE — Progress Notes (Signed)
D:Pt reports that she hears multiple voices that are scary to her. Pt was started on haldol. She is in the group room in the afternoon group.  A:Offered support, encouragement and 15 minute checks. R:Pt denies si and hi. Safety maintained on the unit.

## 2017-05-20 NOTE — Discharge Instructions (Signed)
Instructions from the emergency department:  One of your blood tests for your kidney function, creatinine, was slightly elevated on your visit to the emergency department. I expect that this will resolve after we gave you fluids and encouraged hydration. I would like this to be reevaluated in a week by a primary care provider's clinic.

## 2017-05-20 NOTE — Progress Notes (Signed)
Patient awakened and requested towels for a shower, then returned to bed before showering. Patient denies SI/HI/AVH or pain. Patient contracts for safety on the unit. Patient denies concerns for Clinical research associatewriter. Trazodone held at this time. Patient has returned to bed and is sleeping again. Will continue to monitor.
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
## 2017-05-20 NOTE — ED Notes (Signed)
[facility]ori[facility] [facility]
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
## 2017-05-20 NOTE — Progress Notes (Signed)
Nursing Progress Note 1900-0730  D) Patient has been in bed sleeping since change of shift. Respirations even and unlabored. Patient appears in no acute distress. No scheduled medications at this time except for trazodone- will not awaken patient at this time and will hold unless patient requests.   A) Patient safety maintained with q15 min safety checks. Low fall risk precautions in place.  R) Patient remains safe on the unit at this time.  Will continue to monitor.

## 2017-05-20 NOTE — Progress Notes (Signed)
[facility] Group Notes:  (Nursing/MHT/Case Management/Adjunct)  Date:  05/20/2017  Time:  0930   Type of Therapy:  Nurse Education  Participation Level:  Did Not Attend  Participation Quality:    Affect:    Cognitive:    Insight:    Engagement in Group:    Modes of Intervention:    Summary of Progress/Problems:  Beatrix ShipperWright, Tujuana Kilmartin Martin 05/20/2017, 11:38 AM

## 2017-05-20 NOTE — Plan of Care (Signed)
Problem: Safety: Goal: Periods of time without injury will increase Outcome: Progressing Patient is on q15 minute safety checks and low fall risk precautions. Patient contracts for safety on the unit and remains safe at this time.   
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
## 2017-05-21 MED ORDER — HALOPERIDOL 5 MG PO TABS
5.0000 mg | ORAL_TABLET | Freq: Every day | ORAL | Status: DC
  Administered 2017-05-22 – 2017-05-24 (×3): 5 mg via ORAL
  Filled 2017-05-21 (×4): qty 1

## 2017-05-21 MED ORDER — HALOPERIDOL 5 MG PO TABS
10.0000 mg | ORAL_TABLET | Freq: Every day | ORAL | Status: DC
Start: 2017-05-21 — End: 2017-05-24
  Administered 2017-05-21 – 2017-05-23 (×3): 10 mg via ORAL
  Filled 2017-05-21 (×5): qty 2

## 2017-05-21 MED ORDER — HALOPERIDOL DECANOATE 100 MG/ML IM SOLN
200.0000 mg | Freq: Once | INTRAMUSCULAR | Status: AC
  Administered 2017-05-22: 200 mg via INTRAMUSCULAR
  Filled 2017-05-21: qty 2
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
## 2017-05-21 NOTE — Progress Notes (Signed)
Recreation Therapy Notes  Date: 05/21/17 Time: 0930 Location: 300 Hall Group Room  Group Topic: Stress Management  Goal Area(s) Addresses:  Patient will verbalize importance of using healthy stress management.  Patient will identify positive emotions associated with healthy stress management.   Intervention: Stress Management  Activity :  Meditation.  LRT introduced the stress management technique of meditation.  LRT played a meditation from the Calm app to allow patients to engage in meditation to focus on trying to center their thoughts.  Education:  Stress Management, Discharge Planning.   Education Outcome: Acknowledges edcuation/In group clarification offered/Needs additional education  Clinical Observations/Feedback: Pt did not attend group.   Caroll Rancher, LRT/CTRS         Caroll Rancher A 05/21/2017 12:50 PM

## 2017-05-21 NOTE — Tx Team (Signed)
Interdisciplinary Treatment and Diagnostic Plan Update  05/21/2017 Time of Session: 9:00 AM Nicole Manning MRN: [id number]  Principal Diagnosis: Schizophrenia, paranoid (HCC)  Secondary Diagnoses: Principal Problem:   Schizophrenia, paranoid ([facility]) Active Problems:   Substance-induced psychotic disorder ([facility])   Current Medications:  Current Facility-Administered Medications  Medication Dose Route Frequency Provider Last Rate Last Dose  . acetaminophen (TYLENOL) tablet 650 mg  650 mg Oral Q6H PRN Donell Sievert E, PA-C      . acetaminophen-codeine (TYLENOL #3) 300-30 MG per tablet 1 tablet  1 tablet Oral Daily Kerry Hough, PA-C   1 tablet at 05/21/17 0809  . alum & mag hydroxide-simeth (MAALOX/MYLANTA) 200-200-20 MG/5ML suspension 30 mL  30 mL Oral Q4H PRN Kerry Hough, PA-C      . aspirin EC tablet 325 mg  325 mg Oral BID Kerry Hough, PA-C   325 mg at 05/21/17 0809  . gabapentin (NEURONTIN) capsule 300 mg  300 mg Oral TID Kerry Hough, PA-C   300 mg at 05/21/17 1157  . haloperidol (HALDOL) tablet 5 mg  5 mg Oral BID Georgiann Cocker, MD   5 mg at 05/21/17 0809  . hydrOXYzine (ATARAX/VISTARIL) tablet 25 mg  25 mg Oral Q6H PRN Kerry Hough, PA-C      . loratadine (CLARITIN) tablet 10 mg  10 mg Oral Daily PRN Donell Sievert E, PA-C      . magnesium hydroxide (MILK OF MAGNESIA) suspension 30 mL  30 mL Oral Daily PRN Kerry Hough, PA-C      . senna-docusate (Senokot-S) tablet 1 tablet  1 tablet Oral QHS PRN Kerry Hough, PA-C      . traZODone (DESYREL) tablet 50 mg  50 mg Oral QHS,MR X 1 Simon, Spencer E, PA-C       PTA Medications: Prescriptions Prior to Admission  Medication Sig Dispense Refill Last Dose  . acetaminophen-codeine (TYLENOL #3) 300-30 MG tablet Take 1 tablet by mouth every 4 (four) hours as needed for moderate pain. For ankle and back pain   05/19/2017 at Unknown time  . cetirizine (ZYRTEC) 10 MG tablet Take 10 mg by mouth daily.    05/19/2017 at Unknown time  . gabapentin (NEURONTIN) 300 MG capsule Take 300 mg by mouth 2 (two) times daily.    05/19/2017 at Unknown time  . ibuprofen (ADVIL,MOTRIN) 400 MG tablet Take 400 mg by mouth every 6 (six) hours as needed. For pain   Past Week at Unknown time  . risperidone (RISPERDAL) 4 MG tablet Take 4 mg by mouth at bedtime.   Past Week at Unknown time  . tetrahydrozoline-zinc (VISINE-AC) 0.05-0.25 % ophthalmic solution Place 1-2 drops into both eyes daily as needed (allergies).    Past Month at Unknown time  . acetaminophen-codeine (TYLENOL #3) 300-30 MG tablet take 1 tablet by mouth once daily (Patient not taking: Reported on 05/20/2017) 30 tablet 0 Not Taking at Unknown time  . aspirin EC 325 MG tablet Take 1 tablet (325 mg total) by mouth 2 (two) times daily. (Patient not taking: Reported on 05/20/2017) 84 tablet 0 Not Taking at Unknown time  . benzonatate (TESSALON) 100 MG capsule Take 2 capsules (200 mg total) by mouth 3 (three) times daily as needed for cough. (Patient not taking: Reported on 05/20/2017) 21 capsule 0 Not Taking at Unknown time  . metroNIDAZOLE (FLAGYL) 500 MG tablet Take 1 tablet (500 mg total) by mouth 2 (two) times daily. (Patient not taking: Reported on  05/20/2017) 14 tablet 0 Not Taking at Unknown time  . ondansetron (ZOFRAN) 4 MG tablet Take 1-2 tablets (4-8 mg total) by mouth every 8 (eight) hours as needed for nausea or vomiting. (Patient not taking: Reported on 05/20/2017) 40 tablet 0 Not Taking at Unknown time  . oxyCODONE (OXYCONTIN) 10 mg 12 hr tablet Take 1 tablet (10 mg total) by mouth every 12 (twelve) hours. (Patient not taking: Reported on 05/20/2017) 10 tablet 0 Not Taking at Unknown time  . oxyCODONE-acetaminophen (PERCOCET) 5-325 MG tablet Take 1-2 tablets by mouth every 4 (four) hours as needed for severe pain. (Patient not taking: Reported on 05/20/2017) 90 tablet 0 Not Taking at Unknown time  . senna-docusate (SENOKOT S) 8.6-50 MG tablet Take 1 tablet by  mouth at bedtime as needed. (Patient not taking: Reported on 05/20/2017) 30 tablet 1 Not Taking at Unknown time  . traMADol (ULTRAM) 50 MG tablet Take 1 tablet (50 mg total) by mouth 2 (two) times daily. (Patient not taking: Reported on 05/20/2017) 30 tablet 0 Not Taking at Unknown time    Patient Stressors: Medication change or noncompliance Substance abuse  Patient Strengths: Ability for insight Average or above average intelligence Capable of independent living Astronomer fund of knowledge Motivation for treatment/growth Supportive family/friends  Treatment Modalities: Medication Management, Group therapy, Case management,  1 to 1 session with clinician, Psychoeducation, Recreational therapy.   Physician Treatment Plan for Primary Diagnosis: Schizophrenia, paranoid (HCC) Long Term Goal(s): Improvement in symptoms so as ready for discharge Improvement in symptoms so as ready for discharge   Short Term Goals: Ability to identify changes in lifestyle to reduce recurrence of condition will improve Ability to verbalize feelings will improve Ability to disclose and discuss suicidal ideas Ability to demonstrate self-control will improve Ability to identify and develop effective coping behaviors will improve Ability to maintain clinical measurements within normal limits will improve Compliance with prescribed medications will improve Ability to identify triggers associated with substance abuse/mental health issues will improve Ability to identify changes in lifestyle to reduce recurrence of condition will improve Ability to verbalize feelings will improve Ability to disclose and discuss suicidal ideas Ability to demonstrate self-control will improve Ability to identify and develop effective coping behaviors will improve Ability to maintain clinical measurements within normal limits will improve Compliance with prescribed medications will improve Ability to identify  triggers associated with substance abuse/mental health issues will improve  Medication Management: Evaluate patient's response, side effects, and tolerance of medication regimen.  Therapeutic Interventions: 1 to 1 sessions, Unit Group sessions and Medication administration.  Evaluation of Outcomes: Progressing  Physician Treatment Plan for Secondary Diagnosis: Principal Problem:   Schizophrenia, paranoid (HCC) Active Problems:   Substance-induced psychotic disorder (H[facility])  Long Term Goal(s): Improvement in symptoms so as ready for discharge Improvement in symptoms so as ready for discharge   Short Term Goals: Ability to identify changes in lifestyle to reduce recurrence of condition will improve Ability to verbalize feelings will improve Ability to disclose and discuss suicidal ideas Ability to demonstrate self-control will improve Ability to identify and develop effective coping behaviors will improve Ability to maintain clinical measurements within normal limits will improve Compliance with prescribed medications will improve Ability to identify triggers associated with substance abuse/mental health issues will improve Ability to identify changes in lifestyle to reduce recurrence of condition will improve Ability to verbalize feelings will improve Ability to disclose and discuss suicidal ideas Ability to demonstrate self-control will improve Ability to identify and develop  effective coping behaviors will improve Ability to maintain clinical measurements within normal limits will improve Compliance with prescribed medications will improve Ability to identify triggers associated with substance abuse/mental health issues will improve     Medication Management: Evaluate patient's response, side effects, and tolerance of medication regimen.  Therapeutic Interventions: 1 to 1 sessions, Unit Group sessions and Medication administration.  Evaluation of Outcomes: Progressing   RN  Treatment Plan for Primary Diagnosis: Schizophrenia, paranoid (HCC) Long Term Goal(s): Knowledge of disease and therapeutic regimen to maintain health will improve  Short Term Goals: Ability to participate in decision making will improve and Ability to disclose and discuss suicidal ideas  Medication Management: RN will administer medications as ordered by provider, will assess and evaluate patient's response and provide education to patient for prescribed medication. RN will report any adverse and/or side effects to prescribing provider.  Therapeutic Interventions: 1 on 1 counseling sessions, Psychoeducation, Medication administration, Evaluate responses to treatment, Monitor vital signs and CBGs as ordered, Perform/monitor CIWA, COWS, AIMS and Fall Risk screenings as ordered, Perform wound care treatments as ordered.  Evaluation of Outcomes: Progressing   LCSW Treatment Plan for Primary Diagnosis: Schizophrenia, paranoid (HCC) Long Term Goal(s): Safe transition to appropriate next level of care at discharge, Engage patient in therapeutic group addressing interpersonal concerns.  Short Term Goals: Engage patient in aftercare planning with referrals and resources, Facilitate acceptance of mental health diagnosis and concerns, Identify triggers associated with mental health/substance abuse issues and Increase skills for wellness and recovery  Therapeutic Interventions: Assess for all discharge needs, 1 to 1 time with Social worker, Explore available resources and support systems, Assess for adequacy in community support network, Educate family and significant other(s) on suicide prevention, Complete Psychosocial Assessment, Interpersonal group therapy.  Evaluation of Outcomes: Progressing   Progress in Treatment: Attending groups: Yes. Participating in groups: Yes. Taking medication as prescribed: Yes. Toleration medication: Yes. Family/Significant other contact made: Yes, individual(s)  contacted:  grandmother,  Patient understands diagnosis: Yes., continuing to educate patient Discussing patient identified problems/goals with staff: Yes. Medical problems stabilized or resolved: Yes. Denies suicidal/homicidal ideation: Yes.admitted w suicidal ideation, contracts for safety on unit Issues/concerns per patient self-inventory: No. Other: na  New problem(s) identified: No, Describe:  CSW assessing  New Short Term/Long Term Goal(s):  Address "voices in my head that are controlling me"; enable patient to return to work and parent, patient voices concern re continued substance use   Discharge Plan or Barriers: can return to supportive housing and follow up outpatient  Reason for Continuation of Hospitalization: Hallucinations Medication stabilization Suicidal ideation  Estimated Length of Stay:  5 - 7 days, 05/25/17  Attendees: Patient: 05/21/2017 4:26 PM  Physician: Cristino Martes MD 05/21/2017 4:26 PM  Nursing: Armando Reichert RN 05/21/2017 4:26 PM  RN Care Manager: 05/21/2017 4:26 PM  Social Worker: Nicole Griffins LCSW 05/21/2017 4:26 PM  Recreational Therapist:  05/21/2017 4:26 PM  Other:  05/21/2017 4:26 PM  Other:  05/21/2017 4:26 PM  Other: 05/21/2017 4:26 PM    Scribe for Treatment Team: Sallee Lange, LCSW 05/21/2017 4:26 PM

## 2017-05-21 NOTE — Progress Notes (Signed)
D.  Pt in bed on approach, states she has not been sleeping well.  Pt did not feel well enough to attend evening AA group.  Pt has remained in bed thus far through shift.  Minimal interaction on unit.  Pt denies SI/HI/AVH at this time.  A.  Support and encouragement offered, medication given as ordered   R.  Pt remains safe on the unit, will continue to monitor.

## 2017-05-21 NOTE — [facility] Group Notes (Signed)
[name]  05/21/2017 1:15pm  Type of Therapy and Topic:  Group Therapy:  Feelings around Relapse and Recovery  Participation Level: Pt invited. Did not attend.   Jonathon Jordan, MSW, LCSWA 05/21/2017 4:26 PM

## 2017-05-21 NOTE — Progress Notes (Signed)
D:Pt has been in her room much of the day. Pt reports that she slept well with less intense dreams. Pt is denying auditory and visual hallucinations today. She denies si and hi. A:Offered support, encouragement and 15 minute checks. R:Pt denies si and hi.

## 2017-05-22 NOTE — [facility] Group Notes (Signed)
[facility] Group Notes:  (Nursing/MHT/Case Management/Adjunct)  Date:  05/22/2017  Time:  3:10 PM  Type of Therapy:  meditation for anxiety  Participation Level:  Active  Participation Quality:  Attentive  Affect:  Appropriate  Cognitive:  Alert, Appropriate and Oriented  Insight:  Appropriate  Engagement in Group:  Engaged  Modes of Intervention:  Education and guided meditation  Summary of Progress/Problems: Attended group, participated and interacted with others.  Vinetta Bergamo Derionna Salvador 05/22/2017, 3:10 PM

## 2017-05-22 NOTE — Progress Notes (Signed)
Isolated to room and bed much of day but attended groups and meals. Denies hallucinations and thoughts of self harm. Tearful during group stating that she feels that she is losing memories and that frightens her; another patient suggested to her that she may have early onset dementia and that made her cry harder. Stated that she needs to get her life and her head together in order to be a good mother to her son.

## 2017-05-22 NOTE — Progress Notes (Signed)
Writer observed patient at beginning of shift lying in bed resting. She later came up and requested her medications and c/o nasal stuffiness. She received her scheduled medication and claritin. She reports that she is thankful that her grandmother is supportive of her and is helping to care for her son while she is working on getting better. She did watch tv briefly after taking her medicines. Support given and safety maintained on unit with 15 min checks.
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
## 2017-05-22 NOTE — [facility] Group Notes (Signed)
[facility] [name]  05/22/2017 at  9:10 to 10:00 AM  Type of Therapy and Topic:  Group Therapy: Avoiding Self-Sabotaging and Enabling Behaviors  Participation Level:  Did Not Attend; invited to participate yet did not despite overhead announcement and encouragement by staff     Carney Bern, LCSW

## 2017-05-23 NOTE — Progress Notes (Signed)
Adult Psychoeducational Group Note  Date:  05/23/2017 Time:  1:27 AM  Group Topic/Focus:  Wrap-Up Group:   The focus of this group is to help patients review their daily goal of treatment and discuss progress on daily workbooks.  Participation Level:  Active  Participation Quality:  Appropriate  Affect:  Appropriate  Cognitive:  Appropriate  Insight: Appropriate  Engagement in Group:  Engaged  Modes of Intervention:  Discussion  Additional Comments:  Pt attended the AA group without any issues.  Felipa Furnace 05/23/2017, 1:27 AM

## 2017-05-23 NOTE — Progress Notes (Signed)
Patient has been up in the dayroom watching tv. She attended group this evening. She is hopeful to discharge on tomorrow reporting that she needs to get back to work and she misses her son. She still has nasal stuffiness and requested claritin which she received with scheduled hs medications complaints. Patient currently denies si/hi/a/v hall. Support and encouragement offered, safety maintained on unit, will continue to monitor.

## 2017-05-23 NOTE — [facility] Group Notes (Signed)
[facility] Group Notes:  (Nursing/MHT/Case Management/Adjunct)  Date:  05/23/2017  Time:  3:32 PM  Type of Therapy:  Guided Meditation Group  Participation Level:  Active  Participation Quality:  Attentive  Affect:  Anxious  Cognitive:  Alert and Oriented  Insight:  Appropriate  Engagement in Group:  Engaged  Modes of Intervention:  Education and Exploration  Summary of Progress/Problems: Attended "guided meditation for new beginnings"; participated appropriately.   Lonzo Saulter M Bryli Mantey 05/23/2017, 3:32 PM 

## 2017-05-23 NOTE — [facility] Group Notes (Signed)
[facility] [name]  05/23/2017  10:15 to 11 AM   Type of Therapy and Topic: Group Therapy: Feelings Around Returning Home & Establishing a Supportive Framework   Participation Level: Did Not attend; invited to participate yet did not despite overhead announcement and encouragement by staff     Catherine C Harrill, LCSW     
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
## 2017-05-23 NOTE — Plan of Care (Signed)
Problem: Activity: Goal: Interest or engagement in activities will improve Outcome: Progressing Attended groups, still isolating to bed between activities. Went to lunch but returned early c/o nausea. Goal: Sleeping patterns will improve Outcome: Progressing Reports good sleep.  Problem: Education: Goal: Emotional status will improve Outcome: Progressing Reports decreased depression and denies suicidal thoughts Goal: Mental status will improve Outcome: Progressing Denies psychotic symptoms. Expresses anxiety about memory loss in the last year and a half.  Problem: Coping: Goal: Ability to demonstrate self-control will improve Outcome: Progressing No behavioral dyscontrol, patient is cooperative and engages appropriately  Problem: Health Behavior/Discharge Planning: Goal: Compliance with treatment plan for underlying cause of condition will improve Outcome: Progressing Patient is compliant with medications and unit activities, expresses improvement in thoughts and feelings.  Problem: Education: Goal: Will be free of psychotic symptoms Outcome: Progressing Denies all psychotic symptoms including AVH and delusions.

## 2017-05-24 MED ORDER — GABAPENTIN 300 MG PO CAPS: 300.0000 mg | ORAL_CAPSULE | Freq: Three times a day (TID) | ORAL | 0 refills | Status: DC

## 2017-05-24 MED ORDER — TRAZODONE HCL 50 MG PO TABS: 50.0000 mg | ORAL_TABLET | Freq: Every day | ORAL | 0 refills | Status: DC

## 2017-05-24 MED ORDER — HALOPERIDOL 10 MG PO TABS: 10.0000 mg | ORAL_TABLET | Freq: Every day | ORAL | 0 refills | Status: DC

## 2017-05-24 NOTE — Progress Notes (Signed)
Nursing Discharge Note 05/24/2017 [id number]  Data Reports sleeping good with PRN sleep med.  Rates depression 7/10, hopelessness 2/10, and anxiety 0/10. Affect blunted, depressed.  Denies HI, SI, AVH.  Patient keeps to self, but appropriate and polite.   Received discharge orders.  Action Spoke with patient 1:1, nurse offered support to patient throughout shift.  Reviewed medications, discharge instructions, and follow up appointments with patient. Medication scripts reviewed and given to patient.  Paperwork, AVS, SRA, and transition record handed to patient.   Escorted off of unit at 1155. Belongings returned per belongings form.  Discharged to lobby with bus pass per SW.    Response Verbalized understanding of discharge teaching. Agrees to contact someone or 911 with thoughts/intent to harm self or others.    To follow up per AVS.
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
## 2017-05-24 NOTE — Progress Notes (Signed)
  [facility] Adult Case Management Discharge Plan :  Will you be returning to the same living situation after discharge:  Yes,  pt returning home. At discharge, do you have transportation home?: Yes,  bus passes provided. Do you have the ability to pay for your medications: Yes,  pt has insurance.  Release of information consent forms completed and in the chart;  Patient's signature needed at discharge.  Patient to Follow up at: Follow-up Information    [facility] Follow up.   Why:  Patient lives in residential program w this agency, receives individual and group counseling.  Services will resume on day of discharge Contact information: [address]  [place], [place] [address] Phone: [phone] Fax: [phone]       [facility] Follow up on 05/26/2017.   Specialty:  Behavioral Health Why:  Patient current for medications management w this provider.  Hospital discharge follow up on 9/19 at 9 AM.  Haloperidol Decanoate was given 05/22/2017; next injection due 10/12. Contact information: [address] [place] [address] [phone]           Next level of care provider has access to [facility] Link:no  Safety Planning and Suicide Prevention discussed: Yes,  with pt and with pt's mother.  Have you used any form of tobacco in the last 30 days? (Cigarettes, Smokeless Tobacco, Cigars, and/or Pipes): No  Has patient been referred to the Quitline?: N/A patient is not a smoker  Patient has been referred for addiction treatment: Yes  Jonathon Jordan, MSW, LCSWA 05/24/2017, 11:31 AM

## 2017-05-24 NOTE — Progress Notes (Signed)
Per pt request, CSW wrote 2 letters with dates of pt's hospital admission. CSW placed letters on pt's chart.  Jonathon Jordan, MSW, Theresia Majors [phone]

## 2017-05-24 NOTE — BHH Suicide Risk Assessment (Signed)
[facility] Discharge Suicide Risk Assessment   Principal Problem: Schizophrenia, paranoid [facility]) Discharge Diagnoses:  Patient Active Problem List   Diagnosis Date Noted  . Schizophrenia, paranoid (HCC) [F20.0] 05/20/2017    Priority: High  . 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 Closed left pilon fracture [S82.872A] 08/07/2015  . S/P ORIF (open reduction internal fixation) fracture [Z96.7, Z87.81] 08/07/2015  . Abnormal uterine bleeding (AUB) [N93.9] 01/24/2014  . BV (bacterial vaginosis) [N76.0, B96.89] 01/24/2014    Total Time spent with patient: 30 minutes  Musculoskeletal: Strength & Muscle Tone: within normal limits Gait & Station: normal Patient leans: N/A  Psychiatric Specialty Exam: Review of Systems  Constitutional: Negative.   HENT: Negative.   Eyes: Negative.   Respiratory: Negative.   Cardiovascular: Negative.   Gastrointestinal: Negative.   Genitourinary: Negative.   Musculoskeletal: Negative.   Skin: Negative.   Neurological: Negative.   Endo/Heme/Allergies: Negative.   Psychiatric/Behavioral: Negative for depression, hallucinations, memory loss, substance abuse and suicidal ideas. The patient is not nervous/anxious and does not have insomnia.     Blood pressure (!) 85/60, pulse 92, temperature 98.7 F (37.1 C), temperature source Oral, resp. rate 16, height  (1.549 m), weight 84.4 kg (186 lb), SpO2 100 %.Body mass index is 35.14 kg/m.  General Appearance: Groomed this morning, pleasant, engaging well and cooperative. Appropriate behavior. Not in any distress. Good relatedness. Not internally stimulated  Eye Contact::  Good  Speech:  Spontaneous, normal prosody. Normal tone and rate.   Volume:  Normal  Mood:  Euthymic  Affect:  Appropriate and Full Range  Thought Process:  Goal Directed  Orientation:  Full (Time, Place, and Person)  Thought Content:  Future oriented. No delusional theme. No preoccupation with violent thoughts.  No negative ruminations. No obsession.  No hallucination in any modality.   Suicidal Thoughts:  No  Homicidal Thoughts:  No  Memory:  Immediate;   Good Recent;   Good Remote;   Good  Judgement:  Good  Insight:  Good  Psychomotor Activity:  Normal  Concentration:  Good  Recall:  Good  Fund of Knowledge:Good  Language: Good  Akathisia:  Negative  Handed:    AIMS (if indicated):     Assets:  Communication Skills Desire for Improvement Financial Resources/Insurance Housing Physical Health Resilience Social Support Talents/Skills Transportation Vocational/Educational  Sleep:  Number of Hours: 6.75  Cognition: WNL  ADL's:  Intact   Clinical Assessment::   31 y.o AAF single, employed, lives with her ten year old son and a room mate. Background history of Bipolar Disorder and SUD. Presented voluntarily via own transport to the ER. Reported severe headache. Reports auditory and visual hallucinations. Reported command to do evil things. UDS is positive for cocaine, THC and opiates.  At the ER reported hearing a lot of negative things. Visual hallucinations of people having sex. Could see the devil and angels. She is reported to have been having some bizarre behavior. She reported burning self in hot water and incense.   Seen today. Pleased that her symptoms has resolved. No hallucination in any modality. No paranoia or any other form of delusion. No longer feels as if her body has been taken over by an external force. Now in control of her actions and her thoughts. Reports being in good spirits. Looking forward to family life and work again. No thoughts of suicide. No homicidal thoughts. No thoughts of violence. Tolerating her medications well. Says she is able to think clearly. No clouding of  consciousness. No evidence of depression. No overwhelming anxiety, no craving for substances.   Nursing staff reports that patient has been appropriate on the unit. Patient has been interacting well  with peers. No behavioral issues. Patient has not voiced any suicidal thoughts. Patient has not been observed to be internally stimulated. Patient has been adherent with treatment recommendations. Patient has been tolerating their medication well.   Patient was discussed at team. Team members feels that patient is back to her baseline level of function. Team agrees with plan to discharge patient today.  Demographic Factors:  NA  Loss Factors: NA  Historical Factors: Family history of mental illness or substance abuse Substance use  Risk Reduction Factors:   Responsible for children under 19 years of age, Sense of responsibility to family, Religious beliefs about death, Employed, Living with another person, especially a relative, Positive social support, Positive therapeutic relationship and Positive coping skills or problem solving skills  Continued Clinical Symptoms:  As above  Cognitive Features That Contribute To Risk:  None    Suicide Risk:  Minimal: No identifiable suicidal ideation.  Patient is not having any thoughts of suicide at this time. Modifiable risk factors targeted during this admission includes psychosis Demographical and historical risk factors cannot be modified. Patient is now engaging well. Patient is reliable and is future oriented. We have buffered patient's support structures. At this point, patient is at low risk of suicide. Patient is aware of the effects of psychoactive substances on decision making process. Patient has been provided with emergency contacts. Patient acknowledges to use resources provided if unforseen circumstances changes their current risk stratification.   Follow-up Information    [facility] Follow up.   Why:  Patient lives in residential program w this agency, receives individual and group counseling.  Services will resume on day of discharge Contact information: [address]  [place], [place] [address] Phone: [phone] Fax: [phone]       [facility] Follow up.   Specialty:  Behavioral Health Why:  Patient current for medications management w this provider.  Appt needed for follow up Contact information: [address] [place] [address] [phone]           Plan Of Care/Follow-up recommendations:  1. Continue current psychotropic medications 2. Mental health and addiction follow up as arranged.  3. Discharge in care of her family 4. Ten day supply of oral haldol   Georgiann Cocker, MD 05/24/2017, 10:09 AM

## 2017-05-24 NOTE — Progress Notes (Signed)
Recreation Therapy Notes  Date: 05/24/17 Time: 0930 Location: 300 Dayroom  Group Topic: Stress Management  Goal Area(s) Addresses:  Patient will verbalize importance of using healthy stress management.  Patient will identify positive emotions associated with healthy stress management.   Behavioral Response: Engaged  Intervention: Stress Management  Activity :  Meditation.  LRT introduced the stress management technique of meditation.  Patients were to listen and follow along as a meditation played from the Calm app.    Education:  Stress Management, Discharge Planning.   Education Outcome: Acknowledges edcuation/In group clarification offered/Needs additional education  Clinical Observations/Feedback: Pt attended group.   Monta Maiorana, LRT/CTRS         Jeriel Vivanco A 05/24/2017 11:58 AM 

## 2017-05-25 ENCOUNTER — Ambulatory Visit (INDEPENDENT_AMBULATORY_CARE_PROVIDER_SITE_OTHER): Payer: Medicaid Other | Admitting: Orthopaedic Surgery

## 2017-05-25 ENCOUNTER — Ambulatory Visit (INDEPENDENT_AMBULATORY_CARE_PROVIDER_SITE_OTHER): Payer: Medicaid Other

## 2017-05-25 ENCOUNTER — Encounter (INDEPENDENT_AMBULATORY_CARE_PROVIDER_SITE_OTHER): Payer: Self-pay | Admitting: Orthopaedic Surgery

## 2017-05-25 DIAGNOSIS — M19172 Post-traumatic osteoarthritis, left ankle and foot: Secondary | ICD-10-CM | POA: Diagnosis not present

## 2017-05-25 DIAGNOSIS — M7672 Peroneal tendinitis, left leg: Secondary | ICD-10-CM

## 2017-05-25 DIAGNOSIS — S82875D Nondisplaced pilon fracture of left tibia, subsequent encounter for closed fracture with routine healing: Secondary | ICD-10-CM

## 2017-05-25 MED ORDER — BUPIVACAINE HCL 0.5 % IJ SOLN
1.0000 mL | INTRAMUSCULAR | Status: AC | PRN
  Administered 2017-05-25: 1 mL via INTRA_ARTICULAR

## 2017-05-25 MED ORDER — LIDOCAINE HCL 1 % IJ SOLN
1.0000 mL | INTRAMUSCULAR | Status: AC | PRN
  Administered 2017-05-25: 1 mL

## 2017-05-25 MED ORDER — METHYLPREDNISOLONE ACETATE 40 MG/ML IJ SUSP
40.0000 mg | INTRAMUSCULAR | Status: AC | PRN
  Administered 2017-05-25: 40 mg via INTRA_ARTICULAR
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
## 2017-06-01 ENCOUNTER — Telehealth (INDEPENDENT_AMBULATORY_CARE_PROVIDER_SITE_OTHER): Payer: Self-pay | Admitting: Orthopaedic Surgery

## 2017-06-01 MED ORDER — ACETAMINOPHEN-CODEINE #3 300-30 MG PO TABS: 1.0000 | ORAL_TABLET | Freq: Every day | ORAL | 0 refills | Status: DC | PRN

## 2017-06-01 NOTE — Telephone Encounter (Signed)
CALLED RX INTO PHARMACY 

## 2017-06-01 NOTE — Telephone Encounter (Signed)
Pt called for med refill  Tyneol 3  * [facility] message appeared in [phone]* Pt my chart is blocked

## 2017-06-01 NOTE — Telephone Encounter (Signed)
Please advise 

## 2017-06-01 NOTE — Telephone Encounter (Signed)
30

## 2017-06-02 ENCOUNTER — Ambulatory Visit: Payer: Medicaid Other

## 2017-06-08 ENCOUNTER — Ambulatory Visit: Payer: Medicaid Other | Admitting: Physical Therapy

## 2017-06-15 ENCOUNTER — Ambulatory Visit: Payer: Medicaid Other

## 2017-06-17 ENCOUNTER — Telehealth (INDEPENDENT_AMBULATORY_CARE_PROVIDER_SITE_OTHER): Payer: Self-pay | Admitting: Orthopaedic Surgery

## 2017-06-17 NOTE — Telephone Encounter (Signed)
Pt needs med refill (Tylenol #3)

## 2017-06-18 MED ORDER — ACETAMINOPHEN-CODEINE #3 300-30 MG PO TABS: 1.0000 | ORAL_TABLET | Freq: Every day | ORAL | 0 refills | Status: DC | PRN

## 2017-06-18 NOTE — Telephone Encounter (Signed)
[facility] Per pt [facility] not [facility] power.

## 2017-06-18 NOTE — Telephone Encounter (Signed)
Please advise 

## 2017-06-18 NOTE — Telephone Encounter (Signed)
[facility] does not answer the calls tried calling several times

## 2017-06-18 NOTE — Telephone Encounter (Signed)
30

## 2017-06-22 ENCOUNTER — Ambulatory Visit: Payer: Medicaid Other | Admitting: Physical Therapy

## 2017-06-29 ENCOUNTER — Ambulatory Visit: Payer: Medicaid Other | Attending: Pediatrics

## 2017-08-02 ENCOUNTER — Telehealth (INDEPENDENT_AMBULATORY_CARE_PROVIDER_SITE_OTHER): Payer: Self-pay | Admitting: Orthopaedic Surgery

## 2017-08-02 NOTE — Telephone Encounter (Signed)
Nicole Manning,Nicole Manning  [birth year]   Med refill  Tyneol 3

## 2017-08-02 NOTE — Telephone Encounter (Signed)
Approved #30

## 2017-08-02 NOTE — Telephone Encounter (Signed)
Please advise 

## 2017-08-04 ENCOUNTER — Other Ambulatory Visit (INDEPENDENT_AMBULATORY_CARE_PROVIDER_SITE_OTHER): Payer: Self-pay

## 2017-08-04 MED ORDER — ACETAMINOPHEN-CODEINE #3 300-30 MG PO TABS: 1.0000 | ORAL_TABLET | Freq: Every day | ORAL | 0 refills | Status: DC | PRN

## 2017-08-04 NOTE — Telephone Encounter (Signed)
She called back stating that the pharmacy needed more information regarding Tylenol #3. Called Pharmacy states they need PA (MEDICAID). [facility] PA [facility].   Confirmation #: [id number] W Prior Approval #: [id number]: [id number] Status: APPROVED     Will Call patient to let her know it has been approved.

## 2017-08-04 NOTE — Telephone Encounter (Signed)
Called patient advised I would call Rx into pharm she then hung up as soon as I was telling her that but  [facility]

## 2017-11-01 ENCOUNTER — Telehealth (INDEPENDENT_AMBULATORY_CARE_PROVIDER_SITE_OTHER): Payer: Self-pay | Admitting: Orthopaedic Surgery

## 2017-11-01 NOTE — Telephone Encounter (Signed)
ok 

## 2017-11-01 NOTE — Telephone Encounter (Signed)
See message.

## 2017-11-01 NOTE — Telephone Encounter (Signed)
FYI Patient requesting a cortisone injection in her left ankle, her appt scheduled 2/28 at 8 am.

## 2017-11-04 ENCOUNTER — Ambulatory Visit (INDEPENDENT_AMBULATORY_CARE_PROVIDER_SITE_OTHER): Payer: Medicaid Other | Admitting: Orthopaedic Surgery

## 2017-11-08 ENCOUNTER — Ambulatory Visit (INDEPENDENT_AMBULATORY_CARE_PROVIDER_SITE_OTHER): Payer: Medicaid Other | Admitting: Orthopaedic Surgery

## 2017-11-08 ENCOUNTER — Encounter (INDEPENDENT_AMBULATORY_CARE_PROVIDER_SITE_OTHER): Payer: Self-pay | Admitting: Orthopaedic Surgery

## 2017-11-08 ENCOUNTER — Ambulatory Visit (INDEPENDENT_AMBULATORY_CARE_PROVIDER_SITE_OTHER): Payer: Medicaid Other

## 2017-11-08 DIAGNOSIS — M19172 Post-traumatic osteoarthritis, left ankle and foot: Secondary | ICD-10-CM | POA: Diagnosis not present

## 2017-11-08 DIAGNOSIS — M7672 Peroneal tendinitis, left leg: Secondary | ICD-10-CM

## 2017-11-08 DIAGNOSIS — S82875D Nondisplaced pilon fracture of left tibia, subsequent encounter for closed fracture with routine healing: Secondary | ICD-10-CM

## 2017-11-08 MED ORDER — METHYLPREDNISOLONE ACETATE 40 MG/ML IJ SUSP
40.0000 mg | INTRAMUSCULAR | Status: AC | PRN
  Administered 2017-11-08: 40 mg via INTRA_ARTICULAR

## 2017-11-08 MED ORDER — LIDOCAINE HCL 1 % IJ SOLN
1.0000 mL | INTRAMUSCULAR | Status: AC | PRN
  Administered 2017-11-08: 1 mL

## 2017-11-08 MED ORDER — ACETAMINOPHEN-CODEINE #3 300-30 MG PO TABS: 1.0000 | ORAL_TABLET | Freq: Every day | ORAL | 0 refills | Status: DC | PRN

## 2017-11-08 MED ORDER — BUPIVACAINE HCL 0.25 % IJ SOLN
0.6600 mL | INTRAMUSCULAR | Status: AC | PRN
  Administered 2017-11-08: .66 mL via INTRA_ARTICULAR
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
## 2017-12-07 ENCOUNTER — Telehealth (INDEPENDENT_AMBULATORY_CARE_PROVIDER_SITE_OTHER): Payer: Self-pay | Admitting: Orthopaedic Surgery

## 2017-12-07 NOTE — Telephone Encounter (Signed)
Please advise 

## 2017-12-07 NOTE — Telephone Encounter (Signed)
Ok #60 

## 2017-12-07 NOTE — Telephone Encounter (Signed)
Patient requesting RX refill on Tylenol # 3. She said it had to go through Medicaid last time so her payment was $3. Not sure if you needed to know this. Please call [facility] [address] [facility] [phone]

## 2017-12-08 ENCOUNTER — Telehealth (INDEPENDENT_AMBULATORY_CARE_PROVIDER_SITE_OTHER): Payer: Self-pay | Admitting: Orthopaedic Surgery

## 2017-12-08 ENCOUNTER — Other Ambulatory Visit (INDEPENDENT_AMBULATORY_CARE_PROVIDER_SITE_OTHER): Payer: Self-pay

## 2017-12-08 MED ORDER — ACETAMINOPHEN-CODEINE #3 300-30 MG PO TABS: 1.0000 | ORAL_TABLET | Freq: Every day | ORAL | 0 refills | Status: DC | PRN

## 2017-12-08 NOTE — Telephone Encounter (Signed)
SENT TO PHARM

## 2017-12-08 NOTE — Telephone Encounter (Signed)
Approval Entry Complete Confirmation #: [id number]: [id number] W Prior Approval #: Status: SUSPENDED

## 2017-12-08 NOTE — Telephone Encounter (Signed)
Message sent in error

## 2017-12-08 NOTE — Telephone Encounter (Signed)
Patient called this morning stating that this medication required a prior authorization in order for her to only pay $3 for her copay.  Thank you.

## 2017-12-11 ENCOUNTER — Other Ambulatory Visit: Payer: Self-pay

## 2017-12-11 ENCOUNTER — Encounter (HOSPITAL_COMMUNITY): Payer: Self-pay | Admitting: Emergency Medicine

## 2017-12-11 ENCOUNTER — Emergency Department (HOSPITAL_COMMUNITY)
Admission: EM | Admit: 2017-12-11 | Discharge: 2017-12-11 | Disposition: A | Discharge status: Home / Self Care | Payer: Medicaid Other | Attending: Emergency Medicine | Admitting: Emergency Medicine

## 2017-12-11 DIAGNOSIS — R1013 Epigastric pain: Secondary | ICD-10-CM | POA: Diagnosis not present

## 2017-12-11 DIAGNOSIS — R112 Nausea with vomiting, unspecified: Secondary | ICD-10-CM

## 2017-12-11 DIAGNOSIS — Z87891 Personal history of nicotine dependence: Secondary | ICD-10-CM | POA: Diagnosis not present

## 2017-12-11 LAB — URINALYSIS, ROUTINE W REFLEX MICROSCOPIC
Bilirubin Urine: NEGATIVE
Glucose, UA: NEGATIVE mg/dL
Ketones, ur: NEGATIVE mg/dL
Leukocytes, UA: NEGATIVE
Nitrite: NEGATIVE
Protein, ur: NEGATIVE mg/dL
Specific Gravity, Urine: 1.01 (ref 1.005–1.030)
pH: 7 (ref 5.0–8.0)

## 2017-12-11 LAB — COMPREHENSIVE METABOLIC PANEL
ALT: 44 U/L (ref 14–54)
AST: 34 U/L (ref 15–41)
Albumin: 3.8 g/dL (ref 3.5–5.0)
Alkaline Phosphatase: 51 U/L (ref 38–126)
Anion gap: 9 (ref 5–15)
BUN: 8 mg/dL (ref 6–20)
CHLORIDE: 104 mmol/L (ref 101–111)
CO2: 25 mmol/L (ref 22–32)
Calcium: 8.3 mg/dL — ABNORMAL LOW (ref 8.9–10.3)
Creatinine, Ser: 0.9 mg/dL (ref 0.44–1.00)
Glucose, Bld: 100 mg/dL — ABNORMAL HIGH (ref 65–99)
POTASSIUM: 3.4 mmol/L — AB (ref 3.5–5.1)
SODIUM: 138 mmol/L (ref 135–145)
Total Bilirubin: 0.6 mg/dL (ref 0.3–1.2)
Total Protein: 7.2 g/dL (ref 6.5–8.1)

## 2017-12-11 LAB — CBC
HCT: 37.5 % (ref 36.0–46.0)
HEMOGLOBIN: 11.8 g/dL — AB (ref 12.0–15.0)
MCH: 28.4 pg (ref 26.0–34.0)
MCHC: 31.5 g/dL (ref 30.0–36.0)
MCV: 90.4 fL (ref 78.0–100.0)
Platelets: 325 10*3/uL (ref 150–400)
RBC: 4.15 MIL/uL (ref 3.87–5.11)
RDW: 12.6 % (ref 11.5–15.5)
WBC: 6.6 10*3/uL (ref 4.0–10.5)

## 2017-12-11 LAB — LIPASE, BLOOD: Lipase: 26 U/L (ref 11–51)

## 2017-12-11 LAB — I-STAT BETA HCG BLOOD, ED (MC, WL, AP ONLY): I-stat hCG, quantitative: <5 m[IU]/mL (ref <5)

## 2017-12-11 MED ORDER — SODIUM CHLORIDE 0.9 % IV BOLUS
1000.0000 mL | Freq: Once | INTRAVENOUS | Status: AC
  Administered 2017-12-11: 1000 mL via INTRAVENOUS

## 2017-12-11 MED ORDER — FAMOTIDINE IN NACL 20-0.9 MG/50ML-% IV SOLN
20.0000 mg | Freq: Once | INTRAVENOUS | Status: AC
  Administered 2017-12-11: 20 mg via INTRAVENOUS
  Filled 2017-12-11: qty 50

## 2017-12-11 MED ORDER — FAMOTIDINE 20 MG PO TABS: 20.0000 mg | ORAL_TABLET | Freq: Two times a day (BID) | ORAL | 0 refills | Status: DC

## 2017-12-11 MED ORDER — ONDANSETRON HCL 4 MG/2ML IJ SOLN
4.0000 mg | Freq: Once | INTRAMUSCULAR | Status: AC
  Administered 2017-12-11: 4 mg via INTRAVENOUS
  Filled 2017-12-11: qty 2

## 2017-12-11 MED ORDER — ONDANSETRON 4 MG PO TBDP: ORAL_TABLET | ORAL | 0 refills | Status: DC

## 2017-12-11 NOTE — ED Triage Notes (Signed)
Pt. Stated, I started throwing up and having stomach pain since early this morning.

## 2017-12-11 NOTE — ED Notes (Signed)
Pt given ginger ale by PA, tolerating well
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
## 2017-12-11 NOTE — Discharge Instructions (Signed)
Your evaluation today is very reassuring, lab work looks good, I feel your symptoms are likely due to a virus versus from the sushi weight last night.  Please continue to use Zofran for nausea and Pepcid twice daily before breakfast and dinner.  Make sure you are drinking plenty of fluids, advance diet slowly as tolerated.  I also encourage you to start taking a probiotic daily, these can be found over-the-counter I like the brand Culturelle.  Please follow-up with your primary care doctor.  Return to the emergency department for worsening or localizing abdominal pain, persistent nausea vomiting unable to tolerate fluids, severe diarrhea or blood in the stool, persistent fevers or chills or other new or concerning symptoms.

## 2017-12-11 NOTE — ED Notes (Signed)
Pt reports eating "bad sushi" last night and thinks that is the cause for her abd pain and emesis

## 2017-12-11 NOTE — ED Notes (Signed)
Pt stable, ambulatory, states understanding of discharge instructions 

## 2018-01-03 ENCOUNTER — Telehealth (INDEPENDENT_AMBULATORY_CARE_PROVIDER_SITE_OTHER): Payer: Self-pay | Admitting: Orthopaedic Surgery

## 2018-01-03 NOTE — Telephone Encounter (Signed)
30

## 2018-01-03 NOTE — Telephone Encounter (Signed)
Patient called requesting a refill of Tylenol 3.  Please call patient back to advise

## 2018-01-03 NOTE — Telephone Encounter (Signed)
Please advise 

## 2018-01-06 ENCOUNTER — Other Ambulatory Visit (INDEPENDENT_AMBULATORY_CARE_PROVIDER_SITE_OTHER): Payer: Self-pay

## 2018-01-06 MED ORDER — ACETAMINOPHEN-CODEINE #3 300-30 MG PO TABS: 1.0000 | ORAL_TABLET | Freq: Every day | ORAL | 0 refills | Status: DC | PRN

## 2018-01-06 NOTE — Telephone Encounter (Signed)
Called into pharm  

## 2018-02-14 ENCOUNTER — Other Ambulatory Visit (INDEPENDENT_AMBULATORY_CARE_PROVIDER_SITE_OTHER): Payer: Self-pay

## 2018-02-14 ENCOUNTER — Telehealth (INDEPENDENT_AMBULATORY_CARE_PROVIDER_SITE_OTHER): Payer: Self-pay | Admitting: Orthopaedic Surgery

## 2018-02-14 MED ORDER — ACETAMINOPHEN-CODEINE #3 300-30 MG PO TABS: 1.0000 | ORAL_TABLET | Freq: Every day | ORAL | 0 refills | Status: DC | PRN

## 2018-02-14 NOTE — Telephone Encounter (Signed)
Please advise 

## 2018-02-14 NOTE — Telephone Encounter (Signed)
Patient called asking for a refill on her tylenol 3. CB # [phone]

## 2018-02-14 NOTE — Telephone Encounter (Signed)
30

## 2018-02-14 NOTE — Telephone Encounter (Signed)
Called into pharmacy

## 2018-02-26 ENCOUNTER — Emergency Department (HOSPITAL_COMMUNITY)
Admission: EM | Admit: 2018-02-26 | Discharge: 2018-02-26 | Disposition: A | Discharge status: Home / Self Care | Payer: Medicaid Other | Attending: Physician Assistant | Admitting: Physician Assistant

## 2018-02-26 ENCOUNTER — Other Ambulatory Visit: Payer: Self-pay

## 2018-02-26 ENCOUNTER — Encounter (HOSPITAL_COMMUNITY): Payer: Self-pay | Admitting: Emergency Medicine

## 2018-02-26 ENCOUNTER — Emergency Department (HOSPITAL_COMMUNITY): Payer: Medicaid Other

## 2018-02-26 DIAGNOSIS — Z87891 Personal history of nicotine dependence: Secondary | ICD-10-CM | POA: Insufficient documentation

## 2018-02-26 DIAGNOSIS — M546 Pain in thoracic spine: Secondary | ICD-10-CM

## 2018-02-26 DIAGNOSIS — Z79899 Other long term (current) drug therapy: Secondary | ICD-10-CM | POA: Diagnosis not present

## 2018-02-26 DIAGNOSIS — R0789 Other chest pain: Secondary | ICD-10-CM | POA: Insufficient documentation

## 2018-02-26 LAB — BASIC METABOLIC PANEL
ANION GAP: 7 (ref 5–15)
BUN: 11 mg/dL (ref 6–20)
CHLORIDE: 106 mmol/L (ref 101–111)
CO2: 26 mmol/L (ref 22–32)
Calcium: 9 mg/dL (ref 8.9–10.3)
Creatinine, Ser: 1.06 mg/dL — ABNORMAL HIGH (ref 0.44–1.00)
GFR calc Af Amer: >60 mL/min (ref >60)
GLUCOSE: 88 mg/dL (ref 65–99)
POTASSIUM: 3.8 mmol/L (ref 3.5–5.1)
Sodium: 139 mmol/L (ref 135–145)

## 2018-02-26 LAB — I-STAT TROPONIN, ED
Troponin i, poc: 0 ng/mL (ref 0.00–0.08)
Troponin i, poc: 0 ng/mL (ref 0.00–0.08)

## 2018-02-26 LAB — CBC
HEMATOCRIT: 37.8 % (ref 36.0–46.0)
HEMOGLOBIN: 11.8 g/dL — AB (ref 12.0–15.0)
MCH: 28.1 pg (ref 26.0–34.0)
MCHC: 31.2 g/dL (ref 30.0–36.0)
MCV: 90 fL (ref 78.0–100.0)
Platelets: 301 10*3/uL (ref 150–400)
RBC: 4.2 MIL/uL (ref 3.87–5.11)
RDW: 13.4 % (ref 11.5–15.5)
WBC: 8.6 10*3/uL (ref 4.0–10.5)

## 2018-02-26 LAB — I-STAT BETA HCG BLOOD, ED (MC, WL, AP ONLY)

## 2018-02-26 MED ORDER — FAMOTIDINE IN NACL 20-0.9 MG/50ML-% IV SOLN
20.0000 mg | Freq: Once | INTRAVENOUS | Status: AC
  Administered 2018-02-26: 20 mg via INTRAVENOUS
  Filled 2018-02-26: qty 50

## 2018-02-26 MED ORDER — KETOROLAC TROMETHAMINE 30 MG/ML IJ SOLN
30.0000 mg | Freq: Once | INTRAMUSCULAR | Status: AC
  Administered 2018-02-26: 30 mg via INTRAVENOUS
  Filled 2018-02-26: qty 1

## 2018-02-26 MED ORDER — IBUPROFEN 600 MG PO TABS: 600.0000 mg | ORAL_TABLET | Freq: Four times a day (QID) | ORAL | 0 refills | Status: DC | PRN

## 2018-02-26 MED ORDER — ACETAMINOPHEN 500 MG PO TABS: 500.0000 mg | ORAL_TABLET | Freq: Four times a day (QID) | ORAL | 0 refills | Status: DC | PRN
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
## 2018-02-26 NOTE — Discharge Instructions (Addendum)
Alternate 600 mg of ibuprofen and [phone] mg of Tylenol every 3-4 hours as needed for pain. Do not exceed 4000 mg of Tylenol daily.  Take ibuprofen with food to avoid upset stomach issues.  Apply heat to the back or chest 20 minutes on 20 minutes off for comfort.  Do some gentle stretching to avoid muscle stiffness.  Follow-up with your PCP for reevaluation of your symptoms.  Return to the emergency department immediately for any concerning signs or symptoms develop such as worsening pain, shortness of breath, high fevers, numbness or weakness, or passing out.
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
## 2018-03-04 ENCOUNTER — Telehealth (INDEPENDENT_AMBULATORY_CARE_PROVIDER_SITE_OTHER): Payer: Self-pay | Admitting: Orthopaedic Surgery

## 2018-03-04 NOTE — Telephone Encounter (Signed)
Patient left a vm message requesting an RX refill on the Tylenol #3.  CB#[phone].  Thank you.

## 2018-03-05 ENCOUNTER — Ambulatory Visit (HOSPITAL_COMMUNITY)
Admission: EM | Admit: 2018-03-05 | Discharge: 2018-03-05 | Disposition: A | Discharge status: Home / Self Care | Payer: Medicaid Other | Attending: Family Medicine | Admitting: Family Medicine

## 2018-03-05 ENCOUNTER — Encounter (HOSPITAL_COMMUNITY): Payer: Self-pay | Admitting: *Deleted

## 2018-03-05 DIAGNOSIS — R0789 Other chest pain: Secondary | ICD-10-CM | POA: Diagnosis not present

## 2018-03-05 DIAGNOSIS — M549 Dorsalgia, unspecified: Secondary | ICD-10-CM | POA: Diagnosis not present

## 2018-03-05 MED ORDER — IBUPROFEN 400 MG PO TABS: 400.0000 mg | ORAL_TABLET | Freq: Four times a day (QID) | ORAL | 0 refills | Status: DC | PRN

## 2018-03-05 MED ORDER — CYCLOBENZAPRINE HCL 5 MG PO TABS: 5.0000 mg | ORAL_TABLET | Freq: Two times a day (BID) | ORAL | 0 refills | Status: DC | PRN
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
## 2018-03-05 NOTE — Discharge Instructions (Signed)
It was nice seeing you. I am sorry about your pain. Your recent chest xray is normal. Spine exam today is benign. You likely have muscle sprain. Use Ibuprofen as needed for pain and headache. Use Flexeril as needed for muscle spasm. F/U soon as needed.

## 2018-03-07 NOTE — Telephone Encounter (Signed)
30

## 2018-03-07 NOTE — Telephone Encounter (Signed)
See message below °

## 2018-03-08 ENCOUNTER — Telehealth (INDEPENDENT_AMBULATORY_CARE_PROVIDER_SITE_OTHER): Payer: Self-pay | Admitting: Orthopaedic Surgery

## 2018-03-08 NOTE — Telephone Encounter (Signed)
Patient called needing Rx refilled (Tylenol 3) The number to contact patient is [phone]

## 2018-03-09 ENCOUNTER — Other Ambulatory Visit (INDEPENDENT_AMBULATORY_CARE_PROVIDER_SITE_OTHER): Payer: Self-pay

## 2018-03-09 MED ORDER — ACETAMINOPHEN-CODEINE #3 300-30 MG PO TABS: 1.0000 | ORAL_TABLET | Freq: Two times a day (BID) | ORAL | 0 refills | Status: DC | PRN

## 2018-03-09 NOTE — Telephone Encounter (Signed)
Called Rx into pharm

## 2018-03-09 NOTE — Telephone Encounter (Signed)
Tried to call patient went straight to voicemail, could not leave VM bc mailbox is full.

## 2018-03-21 DIAGNOSIS — M25571 Pain in right ankle and joints of right foot: Secondary | ICD-10-CM | POA: Insufficient documentation

## 2018-03-21 DIAGNOSIS — Z5321 Procedure and treatment not carried out due to patient leaving prior to being seen by health care provider: Secondary | ICD-10-CM | POA: Diagnosis not present

## 2018-03-22 ENCOUNTER — Encounter (HOSPITAL_COMMUNITY): Payer: Self-pay

## 2018-03-22 ENCOUNTER — Emergency Department (HOSPITAL_COMMUNITY)
Admission: EM | Admit: 2018-03-22 | Discharge: 2018-03-22 | Disposition: A | Discharge status: Home / Self Care | Payer: Medicaid Other | Source: Home / Self Care | Attending: Emergency Medicine | Admitting: Emergency Medicine

## 2018-03-22 ENCOUNTER — Emergency Department (HOSPITAL_COMMUNITY)
Admission: EM | Admit: 2018-03-22 | Discharge: 2018-03-22 | Disposition: A | Discharge status: Home / Self Care | Payer: Medicaid Other | Attending: Emergency Medicine | Admitting: Emergency Medicine

## 2018-03-22 ENCOUNTER — Other Ambulatory Visit: Payer: Self-pay

## 2018-03-22 ENCOUNTER — Emergency Department (HOSPITAL_COMMUNITY): Payer: Medicaid Other

## 2018-03-22 DIAGNOSIS — Y939 Activity, unspecified: Secondary | ICD-10-CM

## 2018-03-22 DIAGNOSIS — Z87891 Personal history of nicotine dependence: Secondary | ICD-10-CM

## 2018-03-22 DIAGNOSIS — Y999 Unspecified external cause status: Secondary | ICD-10-CM

## 2018-03-22 DIAGNOSIS — Z79899 Other long term (current) drug therapy: Secondary | ICD-10-CM | POA: Insufficient documentation

## 2018-03-22 DIAGNOSIS — X501XXA Overexertion from prolonged static or awkward postures, initial encounter: Secondary | ICD-10-CM | POA: Insufficient documentation

## 2018-03-22 DIAGNOSIS — S82891A Other fracture of right lower leg, initial encounter for closed fracture: Secondary | ICD-10-CM | POA: Insufficient documentation

## 2018-03-22 DIAGNOSIS — Y9289 Other specified places as the place of occurrence of the external cause: Secondary | ICD-10-CM | POA: Insufficient documentation

## 2018-03-22 MED ORDER — ACETAMINOPHEN 500 MG PO TABS
1000.0000 mg | ORAL_TABLET | Freq: Once | ORAL | Status: AC
  Administered 2018-03-22: 1000 mg via ORAL
  Filled 2018-03-22: qty 2

## 2018-03-22 MED ORDER — HYDROCODONE-ACETAMINOPHEN 5-325 MG PO TABS: 1.0000 | ORAL_TABLET | Freq: Four times a day (QID) | ORAL | 0 refills | Status: DC | PRN

## 2018-03-22 NOTE — ED Notes (Signed)
Patient transported to X-ray 

## 2018-03-22 NOTE — ED Triage Notes (Signed)
Pt called from triage with no answer 

## 2018-03-22 NOTE — ED Notes (Signed)
Pt ready for discharge VS documented 

## 2018-03-22 NOTE — ED Notes (Signed)
Bed: WTR6 Expected date:  Expected time:  Means of arrival:  Comments: 
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
## 2018-03-22 NOTE — ED Triage Notes (Signed)
Pt c.o right ankle injury/pain. Pt injured it yesterday while at work. Swelling noted to ankle, pt has ASO brace and has been using ice, and elevation at home.

## 2018-03-22 NOTE — ED Notes (Signed)
Pt requesting to take son upstairs to see his grandmother before she is triaged.

## 2018-03-22 NOTE — Discharge Instructions (Signed)
You can take Tylenol or Ibuprofen as directed for pain. You can alternate Tylenol and Ibuprofen every 4 hours. If you take Tylenol at 1pm, then you can take Ibuprofen at 5pm. Then you can take Tylenol again at 9pm.   Follow the RICE (Rest, Ice, Compression, Elevation) protocol as directed.   Wear the walker boot as directed.  As we discussed, it is important that you follow-up with orthopedics.  Please call their office and arrange for an appointment in the next several days.  Return emergency department for any worsening pain, discoloration of the foot, numbness/weakness or any other worsening or concerning symptoms.

## 2018-03-23 ENCOUNTER — Telehealth (INDEPENDENT_AMBULATORY_CARE_PROVIDER_SITE_OTHER): Payer: Self-pay | Admitting: Orthopaedic Surgery

## 2018-03-23 NOTE — Telephone Encounter (Signed)
Returned call to patient left message to return call [phone]

## 2018-03-25 ENCOUNTER — Telehealth (INDEPENDENT_AMBULATORY_CARE_PROVIDER_SITE_OTHER): Payer: Self-pay | Admitting: Orthopaedic Surgery

## 2018-03-25 ENCOUNTER — Other Ambulatory Visit (INDEPENDENT_AMBULATORY_CARE_PROVIDER_SITE_OTHER): Payer: Self-pay

## 2018-03-25 MED ORDER — ACETAMINOPHEN-CODEINE #3 300-30 MG PO TABS: 1.0000 | ORAL_TABLET | Freq: Two times a day (BID) | ORAL | 0 refills | Status: DC | PRN

## 2018-03-25 NOTE — Telephone Encounter (Signed)
Called in Rx for Tylenol #3, #30 to [facility] [address]

## 2018-03-25 NOTE — Telephone Encounter (Signed)
Could you please advise on message below concerning a Rx refill?  Thank You

## 2018-03-25 NOTE — Telephone Encounter (Signed)
Ok to call in #30

## 2018-03-25 NOTE — Telephone Encounter (Signed)
Patient requesting rx refill on Tylenol 3. States she really needs it. [facility] [address] Patients # [phone]

## 2018-03-30 ENCOUNTER — Encounter (INDEPENDENT_AMBULATORY_CARE_PROVIDER_SITE_OTHER): Payer: Self-pay

## 2018-03-30 ENCOUNTER — Ambulatory Visit (INDEPENDENT_AMBULATORY_CARE_PROVIDER_SITE_OTHER): Payer: Medicaid Other | Admitting: Orthopaedic Surgery

## 2018-03-30 ENCOUNTER — Encounter (INDEPENDENT_AMBULATORY_CARE_PROVIDER_SITE_OTHER): Payer: Self-pay | Admitting: Orthopaedic Surgery

## 2018-03-30 DIAGNOSIS — S93431A Sprain of tibiofibular ligament of right ankle, initial encounter: Secondary | ICD-10-CM
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
## 2018-03-31 ENCOUNTER — Telehealth (INDEPENDENT_AMBULATORY_CARE_PROVIDER_SITE_OTHER): Payer: Self-pay

## 2018-03-31 NOTE — Telephone Encounter (Signed)
PA  [facility]    PENDING AUTH    Confirmation #: [id number]: [id number] W Prior Approval #: Status: SUSPENDED

## 2018-04-05 ENCOUNTER — Telehealth (INDEPENDENT_AMBULATORY_CARE_PROVIDER_SITE_OTHER): Payer: Self-pay | Admitting: Orthopaedic Surgery

## 2018-04-05 ENCOUNTER — Encounter (HOSPITAL_COMMUNITY): Payer: Self-pay | Admitting: Emergency Medicine

## 2018-04-05 ENCOUNTER — Other Ambulatory Visit (INDEPENDENT_AMBULATORY_CARE_PROVIDER_SITE_OTHER): Payer: Self-pay

## 2018-04-05 ENCOUNTER — Other Ambulatory Visit: Payer: Self-pay

## 2018-04-05 ENCOUNTER — Emergency Department (HOSPITAL_COMMUNITY)
Admission: EM | Admit: 2018-04-05 | Discharge: 2018-04-05 | Disposition: A | Discharge status: Home / Self Care | Payer: Medicaid Other | Attending: Emergency Medicine | Admitting: Emergency Medicine

## 2018-04-05 DIAGNOSIS — M7989 Other specified soft tissue disorders: Secondary | ICD-10-CM | POA: Diagnosis not present

## 2018-04-05 DIAGNOSIS — Z87891 Personal history of nicotine dependence: Secondary | ICD-10-CM | POA: Diagnosis not present

## 2018-04-05 DIAGNOSIS — Z79899 Other long term (current) drug therapy: Secondary | ICD-10-CM | POA: Diagnosis not present

## 2018-04-05 DIAGNOSIS — M25571 Pain in right ankle and joints of right foot: Secondary | ICD-10-CM | POA: Insufficient documentation

## 2018-04-05 MED ORDER — TRAMADOL HCL 50 MG PO TABS: 50.0000 mg | ORAL_TABLET | Freq: Two times a day (BID) | ORAL | 0 refills | Status: DC

## 2018-04-05 MED ORDER — HYDROCODONE-ACETAMINOPHEN 5-325 MG PO TABS: 1.0000 | ORAL_TABLET | Freq: Four times a day (QID) | ORAL | 0 refills | Status: DC | PRN
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
## 2018-04-05 NOTE — Telephone Encounter (Signed)
Patient called this morning needing a prior authorization for her Tylenol #3.  Patient states that she is a lot of pain this morning.  CB#[phone].  Thank you.

## 2018-04-05 NOTE — ED Notes (Signed)
Patient verbalizes understanding of discharge instructions. Opportunity for questioning and answers were provided. Armband removed by staff, pt discharged from ED.  

## 2018-04-05 NOTE — Discharge Instructions (Signed)
°  Antiinflammatory medications: Take 600 mg of ibuprofen every 6 hours or 440 mg (over the counter dose) to 500 mg (prescription dose) of naproxen every 12 hours for the next 3 days. After this time, these medications may be used as needed for pain. Take these medications with food to avoid upset stomach. Choose only one of these medications, do not take them together. Tylenol: Should you continue to have additional pain while taking the ibuprofen or naproxen, you may add in tylenol as needed. Your daily total maximum amount of tylenol from all sources should be limited to $RemoveBefor2000mg /day for those with liver problems. Vicodin: May take Vicodin as needed for severe pain.  Do not drive or perform other dangerous activities while taking the Vicodin.  Please note that each pill of Vicodin contains 325 mg of Tylenol and the above dosage limits apply.  Elevate the extremity when ever possible.  Weightbearing as tolerated.

## 2018-04-05 NOTE — Telephone Encounter (Signed)
Confirmation #: [id number] W Benefit Plan: [employer] Health Plan: [id number]   Prior Approval #: [place] Type: PHARMACY  Recipient: [place] BingCHARTARIA S Zwicker Recipient ID: [id number]  Billing Provider: Billing Provider Id:  Requesting Provider Name: Etter SjogrenNAIPING MICHAEL XU Requesting Provider Id: [id number]  Submission Date: 03/31/2018 Status: DENIED Effective Begin Date: Effective End Date: Payer: [place] [employer]ENEFITS # of Attachments: 0

## 2018-04-05 NOTE — Telephone Encounter (Signed)
CALLED INTO PHARM  

## 2018-04-05 NOTE — Telephone Encounter (Signed)
Tramadol #30

## 2018-04-05 NOTE — Telephone Encounter (Signed)
Rx (tylenol#3) was Denied by ins  is there anything else you can prescribe?

## 2018-04-05 NOTE — ED Triage Notes (Signed)
Pt. Stated, I have 2 bad ankles and I went to my orthopedic and he did not give me any pain medication, and I can't even work without it.

## 2018-04-27 ENCOUNTER — Ambulatory Visit (INDEPENDENT_AMBULATORY_CARE_PROVIDER_SITE_OTHER): Payer: Medicaid Other | Admitting: Orthopaedic Surgery

## 2018-04-29 ENCOUNTER — Ambulatory Visit (INDEPENDENT_AMBULATORY_CARE_PROVIDER_SITE_OTHER): Payer: Medicaid Other | Admitting: Orthopaedic Surgery

## 2018-05-05 ENCOUNTER — Ambulatory Visit (INDEPENDENT_AMBULATORY_CARE_PROVIDER_SITE_OTHER): Payer: Medicaid Other | Admitting: Orthopaedic Surgery

## 2018-05-19 ENCOUNTER — Ambulatory Visit (INDEPENDENT_AMBULATORY_CARE_PROVIDER_SITE_OTHER): Payer: Medicaid Other | Admitting: Orthopaedic Surgery

## 2018-05-20 ENCOUNTER — Ambulatory Visit (INDEPENDENT_AMBULATORY_CARE_PROVIDER_SITE_OTHER): Payer: Medicaid Other | Admitting: Orthopaedic Surgery

## 2018-05-25 ENCOUNTER — Encounter (INDEPENDENT_AMBULATORY_CARE_PROVIDER_SITE_OTHER): Payer: Self-pay | Admitting: Orthopaedic Surgery

## 2018-05-25 ENCOUNTER — Ambulatory Visit (INDEPENDENT_AMBULATORY_CARE_PROVIDER_SITE_OTHER): Payer: Medicaid Other | Admitting: Orthopaedic Surgery

## 2018-05-25 ENCOUNTER — Ambulatory Visit (INDEPENDENT_AMBULATORY_CARE_PROVIDER_SITE_OTHER): Payer: Medicaid Other

## 2018-05-25 ENCOUNTER — Ambulatory Visit (INDEPENDENT_AMBULATORY_CARE_PROVIDER_SITE_OTHER): Payer: Self-pay

## 2018-05-25 DIAGNOSIS — G8929 Other chronic pain: Secondary | ICD-10-CM | POA: Diagnosis not present

## 2018-05-25 DIAGNOSIS — M25572 Pain in left ankle and joints of left foot: Secondary | ICD-10-CM | POA: Diagnosis not present

## 2018-05-25 DIAGNOSIS — M25571 Pain in right ankle and joints of right foot: Secondary | ICD-10-CM

## 2018-05-25 MED ORDER — TRAMADOL HCL 50 MG PO TABS: ORAL_TABLET | ORAL | 0 refills | Status: DC

## 2018-05-25 NOTE — Progress Notes (Signed)
Subjective: She is here with chronic left ankle pain.  She is status post ORIF of a pilon fracture years ago.  She has continued to have pain with weightbearing, primarily on the medial aspect.  Intra-articular injection gave only temporary relief.  She is here for possible ultrasound-guided injection.  Objective: Pain seems to localize to the posterior aspect of the ankle, positive Tinel's at the tarsal tunnel.  No pain with supination against resistance or flexion of the great toe against resistance.  Musculoskeletal ultrasound: There is no swelling in the tibialis posterior tendon sheath.  The FHL tendon was not fully visualized, but the visualized portion looked normal.  Posterior tibial nerve is slightly swollen diffusely and palpation directly over the nerve seems to reproduce her pain.  Procedure: Ultrasound-guided tarsal tunnel injection.  After sterile prep with Betadine, injected 2 cc 1% lidocaine without epinephrine and 40 mg methylprednisolone using ultrasound to guide needle placement into the tarsal tunnel near the nerve but avoiding the nerve and vascular structures.  She had complete pain relief within 5 minutes.  She will follow-up with Dr. Roda ShuttersXu as directed.  Possible nerve conduction studies if symptoms recur.
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
## 2018-08-22 ENCOUNTER — Telehealth (INDEPENDENT_AMBULATORY_CARE_PROVIDER_SITE_OTHER): Payer: Self-pay | Admitting: Orthopaedic Surgery

## 2018-08-22 MED ORDER — ACETAMINOPHEN-CODEINE #3 300-30 MG PO TABS: 1.0000 | ORAL_TABLET | Freq: Two times a day (BID) | ORAL | 0 refills | Status: DC

## 2018-08-22 MED ORDER — ACETAMINOPHEN-CODEINE #3 300-30 MG PO TABS: 1.0000 | ORAL_TABLET | Freq: Two times a day (BID) | ORAL | 0 refills | Status: DC | PRN

## 2018-08-22 NOTE — Telephone Encounter (Signed)
Patient called asked if she can write a Rx for (Tylenol 3) or whatever Dr Roda ShuttersXu will prescribe for her. Patient said she is in a lot of pain. Patient said she can barely walk and could not go to work. The number to contact patient is [phone] or [phone]*

## 2018-08-22 NOTE — Telephone Encounter (Signed)
Rx request 

## 2018-08-22 NOTE — Telephone Encounter (Signed)
Yes #30 of tylenol #3

## 2018-08-22 NOTE — Telephone Encounter (Signed)
IC pharm and LM with Rx

## 2018-08-26 ENCOUNTER — Encounter (HOSPITAL_COMMUNITY): Payer: Self-pay | Admitting: Emergency Medicine

## 2018-08-26 ENCOUNTER — Ambulatory Visit (HOSPITAL_COMMUNITY)
Admission: EM | Admit: 2018-08-26 | Discharge: 2018-08-26 | Disposition: A | Discharge status: Home / Self Care | Payer: Medicaid Other | Attending: Internal Medicine | Admitting: Internal Medicine

## 2018-08-26 DIAGNOSIS — R05 Cough: Secondary | ICD-10-CM | POA: Diagnosis not present

## 2018-08-26 DIAGNOSIS — J0110 Acute frontal sinusitis, unspecified: Secondary | ICD-10-CM

## 2018-08-26 DIAGNOSIS — R059 Cough, unspecified: Secondary | ICD-10-CM

## 2018-08-26 MED ORDER — AMOXICILLIN-POT CLAVULANATE 875-125 MG PO TABS: 1.0000 | ORAL_TABLET | Freq: Two times a day (BID) | ORAL | 0 refills | Status: AC

## 2018-08-26 MED ORDER — BENZONATATE 100 MG PO CAPS
100.0000 mg | ORAL_CAPSULE | Freq: Three times a day (TID) | ORAL | 0 refills | Status: DC | PRN
Start: 2018-08-26 — End: 2019-09-06

## 2018-08-26 MED ORDER — ALBUTEROL SULFATE HFA 108 (90 BASE) MCG/ACT IN AERS: 2.0000 | INHALATION_SPRAY | Freq: Four times a day (QID) | RESPIRATORY_TRACT | 0 refills | Status: DC | PRN
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
## 2018-08-26 NOTE — ED Triage Notes (Signed)
Pt sts URI sx with cough and congestion 

## 2018-08-26 NOTE — Discharge Instructions (Addendum)
Recommend start Augmentin 875mg  twice a day as directed. May take Tessalon cough pills 1 every 8 hours as needed. Use Albuterol inhaler 2 puffs every 6 hours as needed for chest tightness. Increase fluid intake to help loosen up mucus. May continue Ibuprofen as needed for pain. Follow-up in 3 to 4 days if not improving.

## 2018-09-20 ENCOUNTER — Ambulatory Visit (INDEPENDENT_AMBULATORY_CARE_PROVIDER_SITE_OTHER): Payer: Medicaid Other | Admitting: Orthopaedic Surgery

## 2018-09-20 ENCOUNTER — Encounter (INDEPENDENT_AMBULATORY_CARE_PROVIDER_SITE_OTHER): Payer: Self-pay | Admitting: Orthopaedic Surgery

## 2018-09-20 DIAGNOSIS — M19172 Post-traumatic osteoarthritis, left ankle and foot: Secondary | ICD-10-CM

## 2018-09-20 DIAGNOSIS — M25572 Pain in left ankle and joints of left foot: Secondary | ICD-10-CM | POA: Diagnosis not present
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
## 2018-09-22 MED ORDER — TRAMADOL HCL 50 MG PO TABS: ORAL_TABLET | ORAL | 0 refills | Status: DC

## 2018-09-22 NOTE — Addendum Note (Signed)
Addended by: Albertina Parr on: 09/22/2018 01:14 PM   Modules accepted: Orders

## 2018-10-24 ENCOUNTER — Emergency Department (HOSPITAL_COMMUNITY): Payer: Medicaid Other

## 2018-10-24 ENCOUNTER — Emergency Department (HOSPITAL_COMMUNITY)
Admission: EM | Admit: 2018-10-24 | Discharge: 2018-10-24 | Disposition: A | Discharge status: Home / Self Care | Payer: Medicaid Other | Attending: Emergency Medicine | Admitting: Emergency Medicine

## 2018-10-24 ENCOUNTER — Encounter (HOSPITAL_COMMUNITY): Payer: Self-pay

## 2018-10-24 DIAGNOSIS — S93402A Sprain of unspecified ligament of left ankle, initial encounter: Secondary | ICD-10-CM | POA: Diagnosis not present

## 2018-10-24 DIAGNOSIS — Z87891 Personal history of nicotine dependence: Secondary | ICD-10-CM | POA: Insufficient documentation

## 2018-10-24 DIAGNOSIS — Y929 Unspecified place or not applicable: Secondary | ICD-10-CM | POA: Diagnosis not present

## 2018-10-24 DIAGNOSIS — Y939 Activity, unspecified: Secondary | ICD-10-CM | POA: Diagnosis not present

## 2018-10-24 DIAGNOSIS — X509XXA Other and unspecified overexertion or strenuous movements or postures, initial encounter: Secondary | ICD-10-CM | POA: Insufficient documentation

## 2018-10-24 DIAGNOSIS — Y999 Unspecified external cause status: Secondary | ICD-10-CM | POA: Insufficient documentation

## 2018-10-24 DIAGNOSIS — S99912A Unspecified injury of left ankle, initial encounter: Secondary | ICD-10-CM | POA: Diagnosis present

## 2018-10-24 MED ORDER — IBUPROFEN 800 MG PO TABS
800.0000 mg | ORAL_TABLET | Freq: Once | ORAL | Status: AC
  Administered 2018-10-24: 800 mg via ORAL
  Filled 2018-10-24: qty 1

## 2018-10-24 MED ORDER — IBUPROFEN 800 MG PO TABS: 800.0000 mg | ORAL_TABLET | Freq: Three times a day (TID) | ORAL | 0 refills | Status: DC | PRN

## 2018-10-24 NOTE — Discharge Instructions (Signed)
As we discussed, you do not have any broken or dislocated bones in your foot or ankle, but you do have an ankle sprain. There is always a chance that a small fracture did not appear on today's x-ray. Please read through the included information about routine injury care (RICE = rest, ice, compression, elevation), and take over-the-counter pain medicine according to label instructions.  If you do not have any reason to avoid ibuprofen, you can also consider taking ibuprofen 600 mg 3 times a day with meals, but do this for no more than 5 days as it may cause to some stomach discomfort over time.  Use crutches if provided and you may bear weight as tolerated.  Follow-up is recommended with the orthopedic surgeon or with your regular doctor. ° ° °Ankle Sprain °An ankle sprain is an injury to the strong, fibrous tissues (ligaments) that hold the bones of your ankle joint together.  °CAUSES °An ankle sprain is usually caused by a fall or by twisting your ankle. Ankle sprains most commonly occur when you step on the outer edge of your foot, and your ankle turns inward. People who participate in sports are more prone to these types of injuries.  °SYMPTOMS  °Pain in your ankle. The pain may be present at rest or only when you are trying to stand or walk. °Swelling. °Bruising. Bruising may develop immediately or within 1 to 2 days after your injury. °Difficulty standing or walking, particularly when turning corners or changing directions. °DIAGNOSIS  °Your caregiver will ask you details about your injury and perform a physical exam of your ankle to determine if you have an ankle sprain. During the physical exam, your caregiver will press on and apply pressure to specific areas of your foot and ankle. Your caregiver will try to move your ankle in certain ways. An X-ray exam may be done to be sure a bone was not broken or a ligament did not separate from one of the bones in your ankle (avulsion fracture).  °TREATMENT  °Certain  types of braces can help stabilize your ankle. Your caregiver can make a recommendation for this. Your caregiver may recommend the use of medicine for pain. If your sprain is severe, your caregiver may refer you to a surgeon who helps to restore function to parts of your skeletal system (orthopedist) or a physical therapist. °HOME CARE INSTRUCTIONS  °Apply ice to your injury for 1-2 days or as directed by your caregiver. Applying ice helps to reduce inflammation and pain. °Put ice in a plastic bag. °Place a towel between your skin and the bag. °Leave the ice on for 15-20 minutes at a time, every 2 hours while you are awake. °Only take over-the-counter or prescription medicines for pain, discomfort, or fever as directed by your caregiver. °Elevate your injured ankle above the level of your heart as much as possible for 2-3 days. °If your caregiver recommends crutches, use them as instructed. Gradually put weight on the affected ankle. Continue to use crutches or a cane until you can walk without feeling pain in your ankle. °If you have a plaster splint, wear the splint as directed by your caregiver. Do not rest it on anything harder than a pillow for the first 24 hours. Do not put weight on it. Do not get it wet. You may take it off to take a shower or bath. °You may have been given an elastic bandage to wear around your ankle to provide support. If the elastic bandage   is too tight (you have numbness or tingling in your foot or your foot becomes cold and blue), adjust the bandage to make it comfortable. °If you have an air splint, you may blow more air into it or let air out to make it more comfortable. You may take your splint off at night and before taking a shower or bath. Wiggle your toes in the splint several times per day to decrease swelling. °SEEK MEDICAL CARE IF:  °You have rapidly increasing bruising or swelling. °Your toes feel extremely cold or you lose feeling in your foot. °Your pain is not relieved  with medicine. °SEEK IMMEDIATE MEDICAL CARE IF: °Your toes are numb or blue. °You have severe pain that is increasing. °MAKE SURE YOU:  °Understand these instructions. °Will watch your condition. °Will get help right away if you are not doing well or get worse. °  °This information is not intended to replace advice given to you by your health care provider. Make sure you discuss any questions you have with your health care provider. °  °Document Released: 08/24/2005 Document Revised: 09/14/2014 Document Reviewed: 09/05/2011 °Elsevier Interactive Patient Education ©2016 Elsevier Inc[employer] ° °Elastic Bandage and RICE °WHAT DOES AN ELASTIC BANDAGE DO? °Elastic bandages come in different shapes and sizes. They generally provide support to your injury and reduce swelling while you are healing, but they can perform different functions. Your health care provider will help you to decide what is best for your protection, recovery, or rehabilitation following an injury. °WHAT ARE SOME GENERAL TIPS FOR USING AN ELASTIC BANDAGE? °Use the bandage as directed by the maker of the bandage that you are using. °Do not wrap the bandage too tightly. This may cut off the circulation in the arm or leg in the area below the bandage. °If part of your body beyond the bandage becomes blue, numb, cold, swollen, or is more painful, your bandage is most likely too tight. If this occurs, remove your bandage and reapply it more loosely. °See your health care provider if the bandage seems to be making your problems worse rather than better. °An elastic bandage should be removed and reapplied every 3-4 hours or as directed by your health care provider. °WHAT IS RICE? °The routine care of many injuries includes rest, ice, compression, and elevation (RICE therapy).  °Rest °Rest is required to allow your body to heal. Generally, you can resume your routine activities when you are comfortable and have been given permission by your health care  provider. °Ice °Icing your injury helps to keep the swelling down and it reduces pain. Do not apply ice directly to your skin. °Put ice in a plastic bag. °Place a towel between your skin and the bag. °Leave the ice on for 20 minutes, 2-3 times per day. °Do this for as Nicole Manning as you are directed by your health care provider. °Compression °Compression helps to keep swelling down, gives support, and helps with discomfort. Compression may be done with an elastic bandage. °Elevation °Elevation helps to reduce swelling and it decreases pain. If possible, your injured area should be placed at or above the level of your heart or the center of your chest. °WHEN SHOULD I SEEK MEDICAL CARE? °You should seek medical care if: °You have persistent pain and swelling. °Your symptoms are getting worse rather than improving. °These symptoms may indicate that further evaluation or further X-rays are needed. Sometimes, X-rays may not show a small broken bone (fracture) until a number of days later. Make   a follow-up appointment with your health care provider. Ask when your X-ray results will be ready. Make sure that you get your X-ray results. °WHEN SHOULD I SEEK IMMEDIATE MEDICAL CARE? °You should seek immediate medical care if: °You have a sudden onset of severe pain at or below the area of your injury. °You develop redness or increased swelling around your injury. °You have tingling or numbness at or below the area of your injury that does not improve after you remove the elastic bandage. °  °This information is not intended to replace advice given to you by your health care provider. Make sure you discuss any questions you have with your health care provider. °  °Document Released: 02/13/2002 Document Revised: 05/15/2015 Document Reviewed: 04/09/2014 °Elsevier Interactive Patient Education ©2016 Elsevier Inc. ° °  °

## 2018-10-24 NOTE — ED Triage Notes (Signed)
Patient states that she stepped wrong yesterday and has increased pain to left ankle and foot. Has hx of surgery to ame in past with hardware placed 2017
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
## 2018-11-08 ENCOUNTER — Emergency Department (HOSPITAL_COMMUNITY)
Admission: EM | Admit: 2018-11-08 | Discharge: 2018-11-08 | Disposition: A | Discharge status: Home / Self Care | Payer: Medicaid Other | Attending: Emergency Medicine | Admitting: Emergency Medicine

## 2018-11-08 ENCOUNTER — Other Ambulatory Visit: Payer: Self-pay

## 2018-11-08 ENCOUNTER — Encounter (HOSPITAL_COMMUNITY): Payer: Self-pay

## 2018-11-08 DIAGNOSIS — S93402D Sprain of unspecified ligament of left ankle, subsequent encounter: Secondary | ICD-10-CM | POA: Insufficient documentation

## 2018-11-08 DIAGNOSIS — J069 Acute upper respiratory infection, unspecified: Secondary | ICD-10-CM | POA: Insufficient documentation

## 2018-11-08 DIAGNOSIS — X501XXA Overexertion from prolonged static or awkward postures, initial encounter: Secondary | ICD-10-CM | POA: Diagnosis not present

## 2018-11-08 DIAGNOSIS — R05 Cough: Secondary | ICD-10-CM | POA: Diagnosis present

## 2018-11-08 MED ORDER — TRAMADOL HCL 50 MG PO TABS: 50.0000 mg | ORAL_TABLET | Freq: Four times a day (QID) | ORAL | 0 refills | Status: DC | PRN

## 2018-11-08 MED ORDER — KETOROLAC TROMETHAMINE 30 MG/ML IJ SOLN
30.0000 mg | Freq: Once | INTRAMUSCULAR | Status: AC
  Administered 2018-11-08: 30 mg via INTRAMUSCULAR
  Filled 2018-11-08: qty 1

## 2018-11-08 NOTE — Discharge Instructions (Signed)
Continue Ibuprofen for pain Take Tramadol for more severe pain Take Cough/cold medicine over the counter Follow up with orthopedics if you are not improving
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
## 2018-11-08 NOTE — ED Triage Notes (Signed)
Pt reports she was diagnosed last week with sprained ankle. States over the weekend she has had URI sx, cough and headache. No distress noted.

## 2018-11-08 NOTE — ED Notes (Signed)
Patient verbalizes understanding of discharge instructions. Opportunity for questioning and answers were provided. Armband removed by staff, pt discharged from ED. Wheeled out to lobby  

## 2018-11-08 NOTE — ED Notes (Signed)
Ice pack applied to L ankle

## 2018-11-09 ENCOUNTER — Ambulatory Visit (INDEPENDENT_AMBULATORY_CARE_PROVIDER_SITE_OTHER): Payer: Medicaid Other | Admitting: Orthopaedic Surgery

## 2018-11-09 ENCOUNTER — Encounter (INDEPENDENT_AMBULATORY_CARE_PROVIDER_SITE_OTHER): Payer: Self-pay | Admitting: Orthopaedic Surgery

## 2018-11-09 DIAGNOSIS — M25572 Pain in left ankle and joints of left foot: Secondary | ICD-10-CM

## 2018-11-09 MED ORDER — TRAMADOL HCL 50 MG PO TABS: 50.0000 mg | ORAL_TABLET | Freq: Four times a day (QID) | ORAL | 0 refills | Status: DC | PRN
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
## 2018-11-10 ENCOUNTER — Telehealth (INDEPENDENT_AMBULATORY_CARE_PROVIDER_SITE_OTHER): Payer: Self-pay | Admitting: Orthopaedic Surgery

## 2018-11-10 NOTE — Telephone Encounter (Signed)
Any desk work or light duty?

## 2018-11-10 NOTE — Telephone Encounter (Signed)
Patient states she was placed in a boot yesterday at her visit and can not work in a boot. And her employer request a out of work note until she return in 3 weeks to see Dr Roda Shutters.

## 2018-11-10 NOTE — Telephone Encounter (Signed)
See message below °

## 2018-11-11 NOTE — Telephone Encounter (Signed)
Called patient no answer LMOM to return call to ask her message below.

## 2018-11-16 NOTE — Telephone Encounter (Signed)
No desk or Light Duty.  Will have to be OOW.

## 2018-11-16 NOTE — Telephone Encounter (Signed)
Ok to do out of work x 2 weeks

## 2018-11-17 ENCOUNTER — Encounter (INDEPENDENT_AMBULATORY_CARE_PROVIDER_SITE_OTHER): Payer: Self-pay

## 2018-11-17 NOTE — Telephone Encounter (Signed)
Note ready for pick up. Patient aware.  

## 2018-11-30 ENCOUNTER — Ambulatory Visit (INDEPENDENT_AMBULATORY_CARE_PROVIDER_SITE_OTHER): Payer: Medicaid Other | Admitting: Orthopaedic Surgery

## 2018-11-30 ENCOUNTER — Encounter (INDEPENDENT_AMBULATORY_CARE_PROVIDER_SITE_OTHER): Payer: Self-pay | Admitting: Orthopaedic Surgery

## 2018-11-30 ENCOUNTER — Other Ambulatory Visit: Payer: Self-pay

## 2018-11-30 DIAGNOSIS — S93402A Sprain of unspecified ligament of left ankle, initial encounter: Secondary | ICD-10-CM | POA: Diagnosis not present

## 2018-11-30 DIAGNOSIS — M76822 Posterior tibial tendinitis, left leg: Secondary | ICD-10-CM | POA: Diagnosis not present

## 2018-11-30 DIAGNOSIS — M25572 Pain in left ankle and joints of left foot: Secondary | ICD-10-CM

## 2018-11-30 MED ORDER — DICLOFENAC SODIUM 1 % TD GEL: 2.0000 g | Freq: Four times a day (QID) | TRANSDERMAL | 1 refills | Status: DC

## 2018-11-30 MED ORDER — TRAMADOL HCL 50 MG PO TABS: 100.0000 mg | ORAL_TABLET | Freq: Four times a day (QID) | ORAL | 2 refills | Status: DC | PRN
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
## 2018-12-14 ENCOUNTER — Telehealth (INDEPENDENT_AMBULATORY_CARE_PROVIDER_SITE_OTHER): Payer: Self-pay

## 2018-12-14 NOTE — Telephone Encounter (Signed)
Called patient. No answer. LMOM to return call. If she calls back please ask the screening questions. Thank you.  Do you have now or have you had in the past 7 days a fever and/or chills?   Do you have now or have you had in the past 7 days a cough?   Do you have now or have you had in the last 7 days nausea, vomiting or abdominal pain?   Have you been exposed to anyone who has tested positive for COVID-19?   Have you or anyone who lives with you traveled within the last month? 

## 2018-12-15 ENCOUNTER — Encounter (INDEPENDENT_AMBULATORY_CARE_PROVIDER_SITE_OTHER): Payer: Self-pay | Admitting: Orthopaedic Surgery

## 2018-12-15 ENCOUNTER — Other Ambulatory Visit: Payer: Self-pay

## 2018-12-15 ENCOUNTER — Ambulatory Visit (INDEPENDENT_AMBULATORY_CARE_PROVIDER_SITE_OTHER): Payer: Medicaid Other | Admitting: Orthopaedic Surgery

## 2018-12-15 DIAGNOSIS — M19172 Post-traumatic osteoarthritis, left ankle and foot: Secondary | ICD-10-CM

## 2018-12-15 DIAGNOSIS — M2142 Flat foot [pes planus] (acquired), left foot: Secondary | ICD-10-CM | POA: Diagnosis not present

## 2018-12-15 DIAGNOSIS — S82875D Nondisplaced pilon fracture of left tibia, subsequent encounter for closed fracture with routine healing: Secondary | ICD-10-CM | POA: Diagnosis not present

## 2018-12-15 DIAGNOSIS — M76822 Posterior tibial tendinitis, left leg: Secondary | ICD-10-CM
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
## 2019-03-19 ENCOUNTER — Encounter (HOSPITAL_COMMUNITY): Payer: Self-pay | Admitting: *Deleted

## 2019-03-19 ENCOUNTER — Emergency Department (HOSPITAL_COMMUNITY)
Admission: EM | Admit: 2019-03-19 | Discharge: 2019-03-20 | Disposition: A | Discharge status: Other Acute Inpatient Hospital | Payer: Medicaid Other | Attending: Emergency Medicine | Admitting: Emergency Medicine

## 2019-03-19 ENCOUNTER — Other Ambulatory Visit: Payer: Self-pay

## 2019-03-19 DIAGNOSIS — F29 Unspecified psychosis not due to a substance or known physiological condition: Secondary | ICD-10-CM

## 2019-03-19 DIAGNOSIS — F209 Schizophrenia, unspecified: Secondary | ICD-10-CM | POA: Insufficient documentation

## 2019-03-19 DIAGNOSIS — Z590 Homelessness: Secondary | ICD-10-CM | POA: Insufficient documentation

## 2019-03-19 DIAGNOSIS — R45851 Suicidal ideations: Secondary | ICD-10-CM

## 2019-03-19 DIAGNOSIS — Z03818 Encounter for observation for suspected exposure to other biological agents ruled out: Secondary | ICD-10-CM | POA: Insufficient documentation

## 2019-03-19 DIAGNOSIS — F1220 Cannabis dependence, uncomplicated: Secondary | ICD-10-CM | POA: Insufficient documentation

## 2019-03-19 LAB — COMPREHENSIVE METABOLIC PANEL
ALT: 34 U/L (ref 0–44)
AST: 19 U/L (ref 15–41)
Albumin: 3.9 g/dL (ref 3.5–5.0)
Alkaline Phosphatase: 77 U/L (ref 38–126)
Anion gap: 10 (ref 5–15)
BUN: 10 mg/dL (ref 6–20)
CO2: 25 mmol/L (ref 22–32)
Calcium: 9.2 mg/dL (ref 8.9–10.3)
Chloride: 103 mmol/L (ref 98–111)
Creatinine, Ser: 1.25 mg/dL — ABNORMAL HIGH (ref 0.44–1.00)
GFR calc Af Amer: >60 mL/min (ref >60)
GFR calc non Af Amer: 56 mL/min — ABNORMAL LOW (ref >60)
Glucose, Bld: 112 mg/dL — ABNORMAL HIGH (ref 70–99)
Potassium: 3.6 mmol/L (ref 3.5–5.1)
Sodium: 138 mmol/L (ref 135–145)
Total Bilirubin: 0.3 mg/dL (ref 0.3–1.2)
Total Protein: 7.5 g/dL (ref 6.5–8.1)

## 2019-03-19 LAB — CBC
HCT: 41.8 % (ref 36.0–46.0)
Hemoglobin: 12.9 g/dL (ref 12.0–15.0)
MCH: 28.2 pg (ref 26.0–34.0)
MCHC: 30.9 g/dL (ref 30.0–36.0)
MCV: 91.3 fL (ref 80.0–100.0)
Platelets: 283 10*3/uL (ref 150–400)
RBC: 4.58 MIL/uL (ref 3.87–5.11)
RDW: 13.3 % (ref 11.5–15.5)
WBC: 10.1 10*3/uL (ref 4.0–10.5)
nRBC: 0 % (ref 0.0–0.2)

## 2019-03-19 LAB — RAPID URINE DRUG SCREEN, HOSP PERFORMED
Amphetamines: NOT DETECTED
Barbiturates: NOT DETECTED
Benzodiazepines: NOT DETECTED
Cocaine: NOT DETECTED
Opiates: NOT DETECTED
Tetrahydrocannabinol: POSITIVE — AB

## 2019-03-19 LAB — SALICYLATE LEVEL: Salicylate Lvl: <7 mg/dL (ref 2.8–30.0)

## 2019-03-19 LAB — ACETAMINOPHEN LEVEL: Acetaminophen (Tylenol), Serum: <10 ug/mL — ABNORMAL LOW (ref 10–30)

## 2019-03-19 LAB — I-STAT BETA HCG BLOOD, ED (MC, WL, AP ONLY): I-stat hCG, quantitative: <5 m[IU]/mL (ref <5)

## 2019-03-19 LAB — SARS CORONAVIRUS 2 BY RT PCR (HOSPITAL ORDER, PERFORMED IN ~~LOC~~ HOSPITAL LAB): SARS Coronavirus 2: NEGATIVE

## 2019-03-19 LAB — ETHANOL: Alcohol, Ethyl (B): <10 mg/dL (ref <10)

## 2019-03-19 MED ORDER — ZIPRASIDONE MESYLATE 20 MG IM SOLR
20.0000 mg | INTRAMUSCULAR | Status: DC | PRN
  Filled 2019-03-19: qty 20

## 2019-03-19 MED ORDER — RISPERIDONE 2 MG PO TBDP
2.0000 mg | ORAL_TABLET | Freq: Three times a day (TID) | ORAL | Status: DC | PRN
  Administered 2019-03-19 – 2019-03-20 (×2): 2 mg via ORAL
  Filled 2019-03-19 (×3): qty 1

## 2019-03-19 MED ORDER — ONDANSETRON HCL 4 MG PO TABS: 4.0000 mg | ORAL_TABLET | Freq: Three times a day (TID) | ORAL | Status: DC | PRN

## 2019-03-19 MED ORDER — ACETAMINOPHEN 325 MG PO TABS
650.0000 mg | ORAL_TABLET | ORAL | Status: DC | PRN
  Administered 2019-03-19: 650 mg via ORAL
  Filled 2019-03-19: qty 2

## 2019-03-19 MED ORDER — ZOLPIDEM TARTRATE 5 MG PO TABS
5.0000 mg | ORAL_TABLET | Freq: Every evening | ORAL | Status: DC | PRN
  Administered 2019-03-19 (×2): 5 mg via ORAL
  Filled 2019-03-19 (×2): qty 1

## 2019-03-19 MED ORDER — LORAZEPAM 1 MG PO TABS
1.0000 mg | ORAL_TABLET | ORAL | Status: AC | PRN
  Administered 2019-03-19: 1 mg via ORAL
  Filled 2019-03-19: qty 1

## 2019-03-19 NOTE — Progress Notes (Signed)
Patient meets criteria for inpatient treatment. No appropriate beds at [facility] currently. CSW faxed referrals to the following facilities for review:  [facility], [facility], [facility], [place], [place], [place], [facility] [place], [place], [place], [place], [place], [place], [place].  TTS will continue to seek bed placement.   Maxie Better, MSW, LCSW Clinical Social Worker 03/19/2019 9:35 AM
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
## 2019-03-19 NOTE — ED Notes (Signed)
Pt called her grandmother & stated that "I just want to die" the grandmother was distraught & asked to speak to this RN & pleaded for the staff to not let her leave, because she needs help.
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
## 2019-03-19 NOTE — ED Notes (Signed)
Gave pt water with lunch

## 2019-03-19 NOTE — ED Notes (Signed)
Lunch tray ordered 

## 2019-03-19 NOTE — ED Notes (Signed)
Pt stated she "Doesn't want to talk to the voices in her head anymore & to make them stop!" She responds to this RN appropriately when interacting with her, when she is alone she is yelling, crying & arguing with these "voices" in her head & getting very loud & agitated.

## 2019-03-19 NOTE — ED Notes (Signed)
Staffing notified, no sitters available

## 2019-03-19 NOTE — ED Notes (Signed)
Geodon was given due to the escalation pt was having with the "voices" she was yelling & arguing with. Pt gladly accepted the injection with full awareness that it is to calm her down, she wants it to put her to sleep.
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
## 2019-03-19 NOTE — ED Notes (Signed)
Pt's grandmother: Akshara Blumenthal [phone]

## 2019-03-19 NOTE — ED Triage Notes (Signed)
Pt reports auditory hallucinations that have been ongoing for a while; states voices have told her to "do everything under the sun." Reports suicidal ideation today with plan to step out in front of a train. Pt has been off her medications "for a while." Changed into paper scrubs; belongings taken to primary nurse for inventory. Pt requesting to keep two pictures at bedside of her son.

## 2019-03-20 ENCOUNTER — Other Ambulatory Visit: Payer: Self-pay

## 2019-03-20 ENCOUNTER — Inpatient Hospital Stay (HOSPITAL_COMMUNITY)
Admission: AD | Admit: 2019-03-20 | Discharge: 2019-03-22 | DRG: 885 | Disposition: A | Discharge status: Home / Self Care | Payer: Medicaid Other | Attending: Psychiatry | Admitting: Psychiatry

## 2019-03-20 ENCOUNTER — Encounter (HOSPITAL_COMMUNITY): Payer: Self-pay

## 2019-03-20 ENCOUNTER — Other Ambulatory Visit: Payer: Self-pay | Admitting: Behavioral Health

## 2019-03-20 DIAGNOSIS — F411 Generalized anxiety disorder: Secondary | ICD-10-CM | POA: Diagnosis present

## 2019-03-20 DIAGNOSIS — F1210 Cannabis abuse, uncomplicated: Secondary | ICD-10-CM | POA: Diagnosis present

## 2019-03-20 DIAGNOSIS — G4700 Insomnia, unspecified: Secondary | ICD-10-CM | POA: Diagnosis present

## 2019-03-20 DIAGNOSIS — Z87891 Personal history of nicotine dependence: Secondary | ICD-10-CM | POA: Diagnosis not present

## 2019-03-20 DIAGNOSIS — K219 Gastro-esophageal reflux disease without esophagitis: Secondary | ICD-10-CM | POA: Diagnosis present

## 2019-03-20 DIAGNOSIS — Z590 Homelessness: Secondary | ICD-10-CM

## 2019-03-20 DIAGNOSIS — Z79899 Other long term (current) drug therapy: Secondary | ICD-10-CM

## 2019-03-20 DIAGNOSIS — R45851 Suicidal ideations: Secondary | ICD-10-CM | POA: Diagnosis present

## 2019-03-20 DIAGNOSIS — F209 Schizophrenia, unspecified: Secondary | ICD-10-CM | POA: Diagnosis present

## 2019-03-20 DIAGNOSIS — Z818 Family history of other mental and behavioral disorders: Secondary | ICD-10-CM

## 2019-03-20 MED ORDER — ALUM & MAG HYDROXIDE-SIMETH 200-200-20 MG/5ML PO SUSP: 30.0000 mL | ORAL | Status: DC | PRN

## 2019-03-20 MED ORDER — DIPHENHYDRAMINE HCL 25 MG PO CAPS
25.0000 mg | ORAL_CAPSULE | Freq: Once | ORAL | Status: AC
  Administered 2019-03-20: 25 mg via ORAL
  Filled 2019-03-20: qty 1

## 2019-03-20 MED ORDER — FLUTICASONE PROPIONATE 50 MCG/ACT NA SUSP
1.0000 | Freq: Every day | NASAL | Status: DC
  Administered 2019-03-20 – 2019-03-22 (×3): 1 via NASAL
  Filled 2019-03-20: qty 16

## 2019-03-20 MED ORDER — FLUTICASONE PROPIONATE 50 MCG/ACT NA SUSP
NASAL | Status: AC
  Filled 2019-03-20: qty 16

## 2019-03-20 MED ORDER — BENZTROPINE MESYLATE 1 MG PO TABS
1.0000 mg | ORAL_TABLET | Freq: Two times a day (BID) | ORAL | Status: DC
  Administered 2019-03-20 – 2019-03-22 (×4): 1 mg via ORAL
  Filled 2019-03-20 (×10): qty 1

## 2019-03-20 MED ORDER — ACETAMINOPHEN 325 MG PO TABS
650.0000 mg | ORAL_TABLET | Freq: Four times a day (QID) | ORAL | Status: DC | PRN
  Administered 2019-03-22: 650 mg via ORAL
  Filled 2019-03-20: qty 2

## 2019-03-20 MED ORDER — HYDROXYZINE HCL 25 MG PO TABS: 25.0000 mg | ORAL_TABLET | Freq: Three times a day (TID) | ORAL | Status: DC | PRN

## 2019-03-20 MED ORDER — TEMAZEPAM 30 MG PO CAPS
30.0000 mg | ORAL_CAPSULE | Freq: Every day | ORAL | Status: DC
  Administered 2019-03-20 – 2019-03-21 (×2): 30 mg via ORAL
  Filled 2019-03-20 (×2): qty 1

## 2019-03-20 MED ORDER — RISPERIDONE 3 MG PO TABS
3.0000 mg | ORAL_TABLET | Freq: Two times a day (BID) | ORAL | Status: DC
  Administered 2019-03-20 – 2019-03-22 (×4): 3 mg via ORAL
  Filled 2019-03-20 (×10): qty 1

## 2019-03-20 MED ORDER — LORAZEPAM 1 MG PO TABS
1.0000 mg | ORAL_TABLET | Freq: Four times a day (QID) | ORAL | Status: DC | PRN
  Administered 2019-03-20: 1 mg via ORAL
  Filled 2019-03-20: qty 1

## 2019-03-20 NOTE — ED Notes (Signed)
Pt made a phone call. Pt spoke to her son and mother.

## 2019-03-20 NOTE — Progress Notes (Signed)
D: Pt passive SI- contracts for safety denies HI/AVH . Pt is pleasant and cooperative. Pt stated she was feeling ok this evening.  A: Pt was offered support and encouragement. Pt was given scheduled medications. Pt was encourage to attend groups. Q 15 minute checks were done for safety.  R: Pt is taking medication. Pt has no complaints.Pt receptive to treatment and safety maintained on unit.

## 2019-03-20 NOTE — ED Notes (Signed)
Lunch tray ordered 

## 2019-03-20 NOTE — Progress Notes (Signed)
Pt accepted to [facility], Bed 507-1 Mordecai Maes, NP is the accepting provider. Neita Garnet is the attending provider.  Call report to [phone]  [facility] ED notified.   Pt is to be IVC'd Pt may be transported by [employer] Pt scheduled to arrive at [facility] once she is served with the [employer]. Judi Cong, MSW, [place] Disposition Clinical Social Work [phone] (cell) [phone] (office)

## 2019-03-20 NOTE — ED Notes (Signed)
Breakfast tray arrived  

## 2019-03-20 NOTE — ED Notes (Signed)
Breakfast tray ordered 

## 2019-03-20 NOTE — Tx Team (Signed)
Initial Treatment Plan 03/20/2019 4:37 PM Nicole Manning [id number]    PATIENT STRESSORS: Health problems   PATIENT STRENGTHS: Ability for insight Average or above average intelligence Motivation for treatment/growth   PATIENT IDENTIFIED PROBLEMS: "obtaining my freedom"  "controlling voices"                   DISCHARGE CRITERIA:  Ability to meet basic life and health needs Adequate post-discharge living arrangements Improved stabilization in mood, thinking, and/or behavior  PRELIMINARY DISCHARGE PLAN: Outpatient therapy Placement in alternative living arrangements  PATIENT/FAMILY INVOLVEMENT: This treatment plan has been presented to and reviewed with the patient, Nicole Manning, and/or family member,   The patient and family have been given the opportunity to ask questions and make suggestions.  Megan Mans, RN 03/20/2019, 4:37 PM

## 2019-03-20 NOTE — ED Notes (Signed)
Pt attempted to ambulate off unit saying "I need to get out of here." redirected to room without difficulty

## 2019-03-20 NOTE — BH Assessment (Signed)
Re-assessment:   Per nurses report patient continues to present the same. No change. Patient continue to report hallucinations.    Durene Cal, NP, continue to recommend inpatient treatment

## 2019-03-20 NOTE — ED Notes (Signed)
Pt crying and very distressed about voices in her head, they are telling her things like she can't take care of her son. Medication given, sang to patient to calm her down, prayed with pt. Continues to yell at voices but is calm and cooperative with staff

## 2019-03-20 NOTE — ED Notes (Signed)
Gave pt an "ER Happy Meal", graham crackers, and Ginger Ale, per Lowella Petties - RN.

## 2019-03-20 NOTE — ED Provider Notes (Signed)
Daily Rounding.  Please see previous provider for full H&P.  In short,  Nicole Manning is a 33 y.o. female presents for suicidal ideations, depression, and auditory hallucinations. No acute events overnight.   Physical Exam  BP 117/78 (BP Location: Right Arm)   Pulse 99   Temp 98.3 F (36.8 C) (Oral)   Resp 20   LMP 03/05/2019   SpO2 100%   Physical Exam Vitals signs and nursing note reviewed.  Constitutional:      General: She is not in acute distress.    Appearance: She is well-developed.  HENT:     Head: Normocephalic and atraumatic.  Eyes:     General: No scleral icterus.       Right eye: No discharge.        Left eye: No discharge.     Conjunctiva/sclera: Conjunctivae normal.  Pulmonary:     Effort: Pulmonary effort is normal.  Neurological:     Mental Status: She is alert.    ED Course/Procedures     Procedures  MDM  Current plan is inpatient behavioral health admission. Placement is pending.        Darlin Drop [place], [place] 03/20/19 1235    Isla Pence, MD 03/20/19 1242

## 2019-03-21 LAB — TSH: TSH: 2.91 u[IU]/mL (ref 0.350–4.500)

## 2019-03-21 LAB — HEMOGLOBIN A1C
Hgb A1c MFr Bld: 5.7 % — ABNORMAL HIGH (ref 4.8–5.6)
Mean Plasma Glucose: 116.89 mg/dL

## 2019-03-21 LAB — LIPID PANEL
Cholesterol: 154 mg/dL (ref 0–200)
HDL: 38 mg/dL — ABNORMAL LOW (ref >40)
LDL Cholesterol: 92 mg/dL (ref 0–99)
Total CHOL/HDL Ratio: 4.1 RATIO
Triglycerides: 120 mg/dL (ref <150)
VLDL: 24 mg/dL (ref 0–40)

## 2019-03-21 NOTE — BHH Suicide Risk Assessment (Signed)
[facility] Admission Suicide Risk Assessment   Nursing information obtained from:  Patient Demographic factors:  Low socioeconomic status, Unemployed, Living alone Current Mental Status:  Self-harm thoughts Loss Factors:  Decrease in vocational status, Financial problems / change in socioeconomic status Historical Factors:  NA Risk Reduction Factors:  Responsible for children under 35 years of age, Sense of responsibility to family, Positive social support  Total Time spent with patient: 45 minutes Principal Problem: Exacerbation and underlying psychotic disorder Diagnosis:  Active Problems:   Schizophrenia ([place])  Subjective Data: Complaining of auditory and visual hallucinations but no thoughts of harming self or others  Continued Clinical Symptoms:  Alcohol Use Disorder Identification Test Final Score (AUDIT): 0 The "Alcohol Use Disorders Identification Test", Guidelines for Use in Primary Care, Second Edition.  World Pharmacologist [facility]). Score between 0-7:  no or low risk or alcohol related problems. Score between 8-15:  moderate risk of alcohol related problems. Score between 16-19:  high risk of alcohol related problems. Score 20 or above:  warrants further diagnostic evaluation for alcohol dependence and treatment.   CLINICAL FACTORS:   Schizophrenia:   Command hallucinatons Less than 10 years old Paranoid or undifferentiated type  Musculoskeletal: Strength & Muscle Tone: within normal limits Gait & Station: normal Patient leans: N/A  Psychiatric Specialty Exam: Physical Exam  ROS  Blood pressure 103/69, pulse (!) 130, temperature 98.1 F (36.7 C), temperature source Oral, resp. rate 18, height 5\' 1"  (1.549 m), weight 81.6 kg, last menstrual period 03/05/2019, SpO2 100 %.Body mass index is 34.01 kg/m.  General Appearance: Casual  Eye Contact:  Minimal  Speech:  Clear and Coherent  Volume:  Decreased  Mood:  Dysphoric  Affect:  Congruent  Thought Process:  Linear  and Descriptions of Associations: Loose  Orientation:  Full (Time, Place, and Person)  Thought Content:  Logical and Hallucinations: Auditory  Suicidal Thoughts:  No  Homicidal Thoughts:  No  Memory:  Immediate;   Fair  Judgement:  Good  Insight:  Good  Psychomotor Activity:  Normal  Concentration:  Concentration: Good  Recall:  Good  Fund of Knowledge:  Good  Language:  Good  Akathisia:  Negative  Handed:  Right  AIMS (if indicated):     Assets:  Communication Skills Leisure Time Physical Health Resilience Social Support  ADL's:  Intact  Cognition:  WNL  Sleep:  Number of Hours: 6.75       COGNITIVE FEATURES THAT CONTRIBUTE TO RISK:  None    SUICIDE RISK:   Minimal: No identifiable suicidal ideation.  Patients presenting with no risk factors but with morbid ruminations; may be classified as minimal risk based on the severity of the depressive symptoms  PLAN OF CARE: see eval  I certify that inpatient services furnished can reasonably be expected to improve the patient's condition.   Johnn Hai, MD 03/21/2019, 10:18 AM
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
## 2019-03-21 NOTE — Progress Notes (Signed)
Patient ID: Nicole Manning, female   DOB: 1986-08-14, 33 y.o.   MRN: [id number]  [place] NOVEL CORONAVIRUS (COVID-19) DAILY CHECK-OFF SYMPTOMS - answer yes or no to each - every day NO YES  Have you had a fever in the past 24 hours?  . Fever (Temp > 37.80C / 100F) X   Have you had any of these symptoms in the past 24 hours? . New Cough .  Sore Throat  .  Shortness of Breath .  Difficulty Breathing .  Unexplained Body Aches   X   Have you had any one of these symptoms in the past 24 hours not related to allergies?   . Runny Nose .  Nasal Congestion .  Sneezing   X   If you have had runny nose, nasal congestion, sneezing in the past 24 hours, has it worsened?  X   EXPOSURES - check yes or no X   Have you traveled outside the state in the past 14 days?  X   Have you been in contact with someone with a confirmed diagnosis of COVID-19 or PUI in the past 14 days without wearing appropriate PPE?  X   Have you been living in the same home as a person with confirmed diagnosis of COVID-19 or a PUI (household contact)?    X   Have you been diagnosed with COVID-19?    X              What to do next: Answered NO to all: Answered YES to anything:   Proceed with unit schedule Follow the [facility] Inpatient Flowsheet.

## 2019-03-21 NOTE — Progress Notes (Signed)
D: Pt denies SI/HI/VH , +ve AH- better and not command. Pt is pleasant and cooperative. Pt stated she was doing better due to not feeling SI today and not having command hallucinations. Pt visible on the unit with bright affect.  A: Pt was offered support and encouragement. Pt was given scheduled medications. Pt was encourage to attend groups. Q 15 minute checks were done for safety.  R:Pt attends groups and interacts well with peers and staff. Pt is taking medication. Pt has no complaints.Pt receptive to treatment and safety maintained on unit.  Problem: Education: Goal: Emotional status will improve Outcome: Progressing   Problem: Education: Goal: Mental status will improve Outcome: Progressing

## 2019-03-21 NOTE — [facility] Group Notes (Signed)
[facility] [name]  Date/Time: 03/21/2019 @ 11am  Type of Therapy and Topic:  Group Therapy:  Overcoming Obstacles  Participation Level:  Active  Description of Group:    In this group patients will be encouraged to explore what they see as obstacles to their own wellness and recovery. They will be guided to discuss their thoughts, feelings, and behaviors related to these obstacles. The group will process together ways to cope with barriers, with attention given to specific choices patients can make. Each patient will be challenged to identify changes they are motivated to make in order to overcome their obstacles. This group will be process-oriented, with patients participating in exploration of their own experiences as well as giving and receiving support and challenge from other group members.  Therapeutic Goals: 1. Patient will identify personal and current obstacles as they relate to admission. 2. Patient will identify barriers that currently interfere with their wellness or overcoming obstacles.  3. Patient will identify feelings, thought process and behaviors related to these barriers. 4. Patient will identify two changes they are willing to make to overcome these obstacles:    Summary of Patient Progress   Patient was active and engaged throughout group therapy. Pt was able to identify her personal and current obstacles which is getting out of the hospital and not having a job. Pt was able to identify barriers that currently are interfering with overcoming her obstacles which is her diagnosis of schizophrenia and having trust issues. Pt was able to identify changes that she is willing to make to overcome her obstacles which is praying and going out and interviewing and trying to obtain a job that is good for her. Pt opened up and received support from other group members and gave her own advice to other group members.    Therapeutic Modalities:   Cognitive Behavioral  Therapy Solution Focused Therapy Motivational Interviewing Relapse Prevention Therapy   Ardelle Anton, LCSW

## 2019-03-21 NOTE — Progress Notes (Signed)
Recreation Therapy Notes  INPATIENT RECREATION THERAPY ASSESSMENT  Patient Details Name: Nicole Manning MRN: [id number] DOB: 1985-12-16 Today's Date: 03/21/2019       Information Obtained From: Patient  Able to Participate in Assessment/Interview: Yes  Patient Presentation: Alert  Reason for Admission (Per Patient): Other (Comments)(Hearing voices)  Patient Stressors: Other (Comment)(Pt stated she couldn't function around her son like she needed to.)  Coping Skills:   Isolation, TV, Music, Exercise, Talk, Prayer, Avoidance  Leisure Interests (2+):  Exercise - Walking, Sports - Swimming, Individual - Reading, Social - Family, [employer] - Travel (Comment)  Frequency of Recreation/Participation: Other (Comment)(None recently)  Awareness of Community Resources:  Yes  Community Resources:  Park, Art therapist  Current Use: Yes  If no, Barriers?:    Expressed Interest in [place]: No  [employer] of Residence:  [place]  Patient Main Form of Transportation: Musician  Patient Strengths:  Good listener; Biomedical scientist  Patient Identified Areas of Improvement:  Not getting angry; Exercise  Patient Goal for Hospitalization:  "to be able to leave"  Current SI (including self-harm):  No  Current HI:  No  Current AVH: Yes(Pt stated she hears things all the time.)  Staff Intervention Plan: Group Attendance, Collaborate with Interdisciplinary Treatment Team  Consent to Intern Participation: N/A    Victorino Sparrow, LRT/CTRS   Victorino Sparrow A 03/21/2019, 12:10 PM

## 2019-03-21 NOTE — Progress Notes (Signed)
Recreation Therapy Notes  Date: 7.14.20 Time: 1012 Location: [address] Dayroom  Group Topic: Anxiety  Goal Area(s) Addresses:  Patient will identify what makes them anxious. Patient will identify coping skills for anxiety.  Intervention: Worksheet  Activity: Introduction to Anxiety.  Patients were to identify what causes anxiety, physical symptoms they experience, thoughts they experience and coping skills they use for anxiety.  Education: Wellness, Dentist.   Education Outcome: Acknowledges education/In group clarification offered/Needs additional education.   Clinical Observations/Feedback:  Pt did not attend group.    Victorino Sparrow, LRT/CTRS         Victorino Sparrow A 03/21/2019 11:24 AM
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
## 2019-03-21 NOTE — Plan of Care (Signed)
D: Pt alert and oriented on the unit. Pt engaging with RN staff and other pts. Pt denies SI/HI, VH, but endorses AH, "The voices are not as loud today." Pt participated during unit groups and activities. Pt is pleasant and cooperative. A: Education, support and encouragement provided, q15 minute safety checks remain in effect. Medications administered per MD orders. R: No reactions/side effects to medicine noted. Pt denies any concerns at this time, and verbally contracts for safety. Pt ambulating on the unit with no issues. Pt remains safe on and off the unit.   Problem: Activity: Goal: Interest or engagement in activities will improve Outcome: Progressing   Problem: Coping: Goal: Ability to verbalize frustrations and anger appropriately will improve Outcome: Progressing

## 2019-03-22 DIAGNOSIS — F209 Schizophrenia, unspecified: Principal | ICD-10-CM

## 2019-03-22 LAB — PROLACTIN: Prolactin: 91.3 ng/mL — ABNORMAL HIGH (ref 4.8–23.3)

## 2019-03-22 MED ORDER — TEMAZEPAM 30 MG PO CAPS: 30.0000 mg | ORAL_CAPSULE | Freq: Every day | ORAL | 0 refills | Status: DC

## 2019-03-22 MED ORDER — LORAZEPAM 1 MG PO TABS
ORAL_TABLET | ORAL | Status: AC
  Filled 2019-03-22: qty 1

## 2019-03-22 MED ORDER — BENZTROPINE MESYLATE 1 MG PO TABS: 1.0000 mg | ORAL_TABLET | Freq: Two times a day (BID) | ORAL | 2 refills | Status: AC

## 2019-03-22 MED ORDER — LORAZEPAM 1 MG PO TABS
1.0000 mg | ORAL_TABLET | Freq: Every day | ORAL | Status: DC
  Administered 2019-03-22: 1 mg via ORAL

## 2019-03-22 MED ORDER — RISPERIDONE 3 MG PO TABS: 6.0000 mg | ORAL_TABLET | Freq: Every day | ORAL | 2 refills | Status: AC

## 2019-03-22 NOTE — Progress Notes (Signed)
  [facility] Adult Case Management Discharge Plan :  Will you be returning to the same living situation after discharge:  Yes,  with a friend At discharge, do you have transportation home?: Yes,  [employer] at 1pm Do you have the ability to pay for your medications: Yes,  medicaid  Release of information consent forms completed and in the chart;  Patient's signature needed at discharge.  Patient to Follow up at: Follow-up Information    [facility] Follow up on 03/30/2019.   Why: Telephonic hospital follow up appointment is Friday, 7/17 at 11:00a. The provider will contact you the day of the appointment. Contact information: [address] [place] [address] [phone]           Next level of care provider has access to Agency and Suicide Prevention discussed: Yes,  pt's grandmother  Have you used any form of tobacco in the last 30 days? (Cigarettes, Smokeless Tobacco, Cigars, and/or Pipes): No  Has patient been referred to the Quitline?: N/A patient is not a smoker  Patient has been referred for addiction treatment: Yes  Trecia Rogers, LCSW 03/22/2019, 10:44 AM

## 2019-03-22 NOTE — [facility] Suicide Risk Assessment (Signed)
[facility] Discharge Suicide Risk Assessment   Principal Problem: Recurrence of psychosis Discharge Diagnoses: Active Problems:   Schizophrenia ([place])   Total Time spent with patient: 45 minutes  Musculoskeletal: Strength & Muscle Tone: within normal limits Gait & Station: normal Patient leans: N/A  Psychiatric Specialty Exam: ROS  Blood pressure 121/87, pulse (!) 126, temperature 98.1 F (36.7 C), temperature source Oral, resp. rate 20, height 5\' 1"  (1.549 m), weight 81.6 kg, last menstrual period 03/05/2019, SpO2 100 %.Body mass index is 34.01 kg/m.  General Appearance: Casual  Eye Contact::  Good  Speech:  Clear and Coherent409  Volume:  Normal  Mood:  Euthymic  Affect:  Full Range  Thought Process:  Coherent and Descriptions of Associations: Intact  Orientation:  Full (Time, Place, and Person)  Thought Content:  Logical  Suicidal Thoughts:  No  Homicidal Thoughts:  No  Memory:  Immediate;   Good  Judgement: nl  Insight:  Good and Fair  Psychomotor Activity:  Normal  Concentration:  Good  Recall:  Good  Fund of Knowledge:Good  Language: Good  Akathisia:  Negative  Handed:  Right  AIMS (if indicated):     Assets:  Communication Skills Desire for Improvement Housing Leisure Time Physical Health Resilience Social Support  Sleep:  Number of Hours: 6  Cognition: WNL  ADL's:  Intact   Mental Status Per Nursing Assessment::   On Admission:  Self-harm thoughts  Demographic Factors:  NA  Loss Factors: NA  Historical Factors: NA  Risk Reduction Factors:   NA/good support and clinical improvement  Continued Clinical Symptoms:  Always tells me the voices are gone told examiner's last night she had less auditory hallucinations at any rate no suicidal thoughts plans or intent despite some possible contradictory evidence about hallucinations Cognitive Features That Contribute To Risk:  None    Suicide Risk:  Minimal: No identifiable suicidal ideation.  Patients  presenting with no risk factors but with morbid ruminations; may be classified as minimal risk based on the severity of the depressive symptoms  Follow-up Information    [facility] Follow up.   Contact information: [address] [place] [address] [phone]           Plan Of Care/Follow-up recommendations:  Activity:  full  Mindy Gali, MD 03/22/2019, 8:26 AM

## 2019-03-22 NOTE — Tx Team (Signed)
Interdisciplinary Treatment and Diagnostic Plan Update  03/22/2019 Time of Session: 09:02am [place] BingChartaria S Macphee MRN: [id number]  Principal Diagnosis: <principal problem not specified>  Secondary Diagnoses: Active Problems:   Schizophrenia (HCC)   Current Medications:  Current Facility-Administered Medications  Medication Dose Route Frequency Provider Last Rate Last Dose  . acetaminophen (TYLENOL) tablet 650 mg  650 mg Oral Q6H PRN Denzil Magnusonhomas, Lashunda, NP   650 mg at 03/22/19 0820  . alum & mag hydroxide-simeth (MAALOX/MYLANTA) 200-200-20 MG/5ML suspension 30 mL  30 mL Oral Q4H PRN Denzil Magnusonhomas, Lashunda, NP      . benztropine (COGENTIN) tablet 1 mg  1 mg Oral BID Malvin JohnsFarah, Brian, MD   1 mg at 03/22/19 0817  . fluticasone (FLONASE) 50 MCG/ACT nasal spray 1 spray  1 spray Each Nare Daily Nira ConnBerry, Jason A, NP   1 spray at 03/22/19 0818  . hydrOXYzine (ATARAX/VISTARIL) tablet 25 mg  25 mg Oral TID PRN Malvin JohnsFarah, Brian, MD      . risperiDONE (RISPERDAL) tablet 3 mg  3 mg Oral BID Malvin JohnsFarah, Brian, MD   3 mg at 03/22/19 0817  . temazepam (RESTORIL) capsule 30 mg  30 mg Oral QHS Malvin JohnsFarah, Brian, MD   30 mg at 03/21/19 2052   PTA Medications: Medications Prior to Admission  Medication Sig Dispense Refill Last Dose  . acetaminophen (TYLENOL) 500 MG tablet Take 1 tablet (500 mg total) by mouth every 6 (six) hours as needed for moderate pain. 60 tablet 0   . acetaminophen-codeine (TYLENOL #3) 300-30 MG tablet Take 1 tablet by mouth 2 (two) times daily. (Patient not taking: Reported on 03/19/2019) 30 tablet 0   . albuterol (PROVENTIL HFA;VENTOLIN HFA) 108 (90 Base) MCG/ACT inhaler Inhale 2 puffs into the lungs every 6 (six) hours as needed for wheezing or shortness of breath. 1 Inhaler 0   . benzonatate (TESSALON) 100 MG capsule Take 1 capsule (100 mg total) by mouth 3 (three) times daily as needed for cough. 21 capsule 0   . celecoxib (CELEBREX) 200 MG capsule Take 200 mg by mouth 2 (two) times daily as needed for pain.      . cyclobenzaprine (FLEXERIL) 5 MG tablet Take 1 tablet (5 mg total) by mouth 2 (two) times daily as needed for muscle spasms. (Patient not taking: Reported on 03/19/2019) 20 tablet 0   . diclofenac sodium (VOLTAREN) 1 % GEL Apply 2 g topically 4 (four) times daily. (Patient not taking: Reported on 03/19/2019) 1 Tube 1   . famotidine (PEPCID) 20 MG tablet Take 20 mg by mouth 2 (two) times daily.     Marland Kitchen. ibuprofen (ADVIL,MOTRIN) 800 MG tablet Take 1 tablet (800 mg total) by mouth every 8 (eight) hours as needed for moderate pain. (Patient not taking: Reported on 03/19/2019) 20 tablet 0   . sertraline (ZOLOFT) 50 MG tablet Take 50 mg by mouth daily.     . traMADol (ULTRAM) 50 MG tablet Take 2 tablets (100 mg total) by mouth every 6 (six) hours as needed. (Patient taking differently: Take 100 mg by mouth every 6 (six) hours as needed for moderate pain. ) 30 tablet 2     Patient Stressors: Health problems  Patient Strengths: Ability for insight Average or above average intelligence Motivation for treatment/growth  Treatment Modalities: Medication Management, Group therapy, Case management,  1 to 1 session with clinician, Psychoeducation, Recreational therapy.   Physician Treatment Plan for Primary Diagnosis: <principal problem not specified> Long Term Goal(s): Improvement in symptoms so as ready for  discharge Improvement in symptoms so as ready for discharge   Short Term Goals: Ability to verbalize feelings will improve Ability to disclose and discuss suicidal ideas Ability to demonstrate self-control will improve Ability to maintain clinical measurements within normal limits will improve Compliance with prescribed medications will improve Ability to demonstrate self-control will improve Ability to identify and develop effective coping behaviors will improve Ability to maintain clinical measurements within normal limits will improve Compliance with prescribed medications will  improve  Medication Management: Evaluate patient's response, side effects, and tolerance of medication regimen.  Therapeutic Interventions: 1 to 1 sessions, Unit Group sessions and Medication administration.  Evaluation of Outcomes: Adequate for Discharge  Physician Treatment Plan for Secondary Diagnosis: Active Problems:   Schizophrenia (HCC)  Long Term Goal(s): Improvement in symptoms so as ready for discharge Improvement in symptoms so as ready for discharge   Short Term Goals: Ability to verbalize feelings will improve Ability to disclose and discuss suicidal ideas Ability to demonstrate self-control will improve Ability to maintain clinical measurements within normal limits will improve Compliance with prescribed medications will improve Ability to demonstrate self-control will improve Ability to identify and develop effective coping behaviors will improve Ability to maintain clinical measurements within normal limits will improve Compliance with prescribed medications will improve     Medication Management: Evaluate patient's response, side effects, and tolerance of medication regimen.  Therapeutic Interventions: 1 to 1 sessions, Unit Group sessions and Medication administration.  Evaluation of Outcomes: Adequate for Discharge   RN Treatment Plan for Primary Diagnosis: <principal problem not specified> Long Term Goal(s): Knowledge of disease and therapeutic regimen to maintain health will improve  Short Term Goals: Ability to participate in decision making will improve, Ability to verbalize feelings will improve, Ability to disclose and discuss suicidal ideas, Ability to identify and develop effective coping behaviors will improve and Compliance with prescribed medications will improve  Medication Management: RN will administer medications as ordered by provider, will assess and evaluate patient's response and provide education to patient for prescribed medication. RN will  report any adverse and/or side effects to prescribing provider.  Therapeutic Interventions: 1 on 1 counseling sessions, Psychoeducation, Medication administration, Evaluate responses to treatment, Monitor vital signs and CBGs as ordered, Perform/monitor CIWA, COWS, AIMS and Fall Risk screenings as ordered, Perform wound care treatments as ordered.  Evaluation of Outcomes: Adequate for Discharge   LCSW Treatment Plan for Primary Diagnosis: <principal problem not specified> Long Term Goal(s): Safe transition to appropriate next level of care at discharge, Engage patient in therapeutic group addressing interpersonal concerns.  Short Term Goals: Engage patient in aftercare planning with referrals and resources and Increase skills for wellness and recovery  Therapeutic Interventions: Assess for all discharge needs, 1 to 1 time with Social worker, Explore available resources and support systems, Assess for adequacy in community support network, Educate family and significant other(s) on suicide prevention, Complete Psychosocial Assessment, Interpersonal group therapy.  Evaluation of Outcomes: Adequate for Discharge   Progress in Treatment: Attending groups: Yes. Participating in groups: Yes. Taking medication as prescribed: Yes. Toleration medication: Yes. Family/Significant other contact made: Yes, individual(s) contacted:  pt's grandmother Patient understands diagnosis: Yes. Discussing patient identified problems/goals with staff: Yes. Medical problems stabilized or resolved: Yes. Denies suicidal/homicidal ideation: Yes. Issues/concerns per patient self-inventory: No. Other:   New problem(s) identified: No, Describe:  None  New Short Term/Long Term Goal(s): Patient is discharging today.   Patient Goals:  "I want to control the voices, my mood swings, and my  anxiety"  Discharge Plan or Barriers: Patient is discharging today. Pt will be doing [facility]care at [facility].   Reason for  Continuation of Hospitalization: Patient is discharging today.   Estimated Length of Stay: Patient is discharging today.   Attendees: Patient: 03/22/2019   Physician: Dr. Johnn Hai, MD 03/22/2019   Nursing: Wille Glaser RN 03/22/2019   RN Care Manager: 03/22/2019   Social Worker: Ardelle Anton, LCSW 03/22/2019   Recreational Therapist:  03/22/2019  Other:  03/22/2019   Other:  03/22/2019  Other: 03/22/2019     Scribe for Treatment Team: Trecia Rogers, LCSW 03/22/2019 10:42 AM

## 2019-03-22 NOTE — Progress Notes (Addendum)
Patient ID: Nicole Manning, female   DOB: [birth year], 33 y.o.   MRN: [id number] D: Patient presents calm and cooperative. Patient denies SI/HI/VH. She continues to have mild AH, but these are not command in nature. She complains of abdominal pain 10/10 of cramping.  A: APAP given for abdominal pain. Patient checked q15 min, and checks reviewed. Reviewed medication changes with patient and educated on side effects. Educated patient on importance of attending group therapy sessions and educated on several coping skills. Encouarged participation in milieu through recreation therapy and attending meals with peers. Support and encouragement provided. Fluids offered. R: Patient receptive to education on medications, and is medication compliant. Patient contracts for safety on the unit.

## 2019-03-22 NOTE — Plan of Care (Signed)
Pt is being discharged.    Keyli Duross, LRT/CTRS 

## 2019-03-22 NOTE — Plan of Care (Signed)
Patient discharged.

## 2019-03-22 NOTE — Progress Notes (Signed)
Recreation Therapy Notes  INPATIENT RECREATION TR PLAN  Patient Details Name: Nicole Manning MRN: [id number] DOB: November 20, 1985 Today's Date: 03/22/2019  Rec Therapy Plan Is patient appropriate for Therapeutic Recreation?: Yes Treatment times per week: about 3 days Estimated Length of Stay: 5-7 days TR Treatment/Interventions: Group participation (Comment)  Discharge Criteria Pt will be discharged from therapy if:: Discharged Treatment plan/goals/alternatives discussed and agreed upon by:: Patient/family  Discharge Summary Short term goals set: See patient care plan Short term goals met: Adequate for discharge Progress toward goals comments: Groups attended Which groups?: Wellness Reason goals not met: Pt is being discharged. Therapeutic equipment acquired: N/A Reason patient discharged from therapy: Discharge from hospital Pt/family agrees with progress & goals achieved: Yes Date patient discharged from therapy: 03/22/19     Victorino Sparrow, LRT/CTRS  Ria Comment, Audreena Sachdeva A 03/22/2019, 11:21 AM

## 2019-03-22 NOTE — Progress Notes (Signed)
Recreation Therapy Notes  Date: 7.15.20 Time: 1010 Location:  [address] Dayroom  Group Topic: Wellness  Goal Area(s) Addresses:  Patient will define components of whole wellness. Patient will verbalize benefit of whole wellness.  Behavioral Response: Engaged  Intervention: Exercise, Music  Activity: Exercise.  LRT played music and allowed each patient to lead the group in an exercise.  Patients were allowed to take breaks as needed.  Education: Wellness, Dentist.   Education Outcome: Acknowledges education/In group clarification offered/Needs additional education.   Clinical Observations/Feedback: Pt was bright and engaged throughout activity.  Pt lead group in exercises.  Pt was social and interactive with peers and staff.    Victorino Sparrow, LRT/CTRS   Ria Comment, Quantel Mcinturff A 03/22/2019 11:06 AM

## 2019-03-22 NOTE — Progress Notes (Signed)
Patient ID: Nicole Manning, female   DOB: 27-May-1986, 33 y.o.   MRN: [id number]   CSW obtained consent [employer] from patient. CSW scheduled [place] for 1pm. Nurse was notified.
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
## 2019-03-22 NOTE — Progress Notes (Signed)
D: Pt A & O X 3. Denies SI, HI, VH. D/C home as ordered. Picked up in lobby by [employer]. A: D/C instructions reviewed with pt including prescriptions and follow up appointment, compliance encouraged. All belongings from locker given to pt at time of departure. Scheduled and PRN medications given with verbal education and effects monitored. Safety checks maintained without incident till time of d/c.  R: Pt receptive to care. Compliant with medications when offered. Denies adverse drug reactions when assessed. Verbalized understanding related to d/c instructions. Signed belonging sheet in agreement with items received from locker. Ambulatory with a steady gait. Appears to be in no physical distress at time of departure.

## 2019-03-22 NOTE — [facility] Suicide Risk Assessment (Signed)
[place] INPATIENT:  Family/Significant Other Suicide Prevention Education  Suicide Prevention Education:  Education Completed; Pt's grandmother, Florene Brill, has been identified by the patient as the family member/significant other with whom the patient will be residing, and identified as the person(s) who will aid the patient in the event of a mental health crisis (suicidal ideations/suicide attempt).  With written consent from the patient, the family member/significant other has been provided the following suicide prevention education, prior to the and/or following the discharge of the patient.  The suicide prevention education provided includes the following:  Suicide risk factors  Suicide prevention and interventions  National Suicide Hotline telephone number  [facility] assessment telephone number  [facility] Emergency Assistance [place] and/or Residential Mobile Crisis Unit telephone number  Request made of family/significant other to:  Remove weapons (e.g., guns, rifles, knives), all items previously/currently identified as safety concern.    Remove drugs/medications (over-the-counter, prescriptions, illicit drugs), all items previously/currently identified as a safety concern.  The family member/significant other verbalizes understanding of the suicide prevention education information provided.  The family member/significant other agrees to remove the items of safety concern listed above.   CSW contacted pt's grandmother, Oktober Glazer. Pt's grandmother asked where the patient is going to go stay. Pt's grandmother stated that her only concern is that the patient has been hearing voices for the past few years and just wants her to get better. Pt's grandmother stated that she is currently taking care of the patient's 35 year old son and wants her to get better so that he can be around his mother.   Trecia Rogers 03/22/2019, 9:49 AM

## 2019-03-22 NOTE — Progress Notes (Signed)
The focus of this group is to help patients establish daily goals to achieve during treatment and discuss how the patient can incorporate goal setting into their daily lives to aide in recovery. 

## 2019-03-22 NOTE — Progress Notes (Signed)
   03/22/19 0820  COVID-19 Daily Checkoff  Have you had a fever (temp > 37.80C/100F)  in the past 24 hours?  No  If you have had runny nose, nasal congestion, sneezing in the past 24 hours, has it worsened? No  COVID-19 EXPOSURE  Have you traveled outside the state in the past 14 days? No  Have you been in contact with someone with a confirmed diagnosis of COVID-19 or PUI in the past 14 days without wearing appropriate PPE? No  Have you been living in the same home as a person with confirmed diagnosis of COVID-19 or a PUI (household contact)? No  Have you been diagnosed with COVID-19? No

## 2019-04-03 ENCOUNTER — Emergency Department (HOSPITAL_BASED_OUTPATIENT_CLINIC_OR_DEPARTMENT_OTHER)
Admission: EM | Admit: 2019-04-03 | Discharge: 2019-04-03 | Disposition: A | Discharge status: Home / Self Care | Payer: Medicaid Other | Attending: Emergency Medicine | Admitting: Emergency Medicine

## 2019-04-03 ENCOUNTER — Emergency Department (HOSPITAL_BASED_OUTPATIENT_CLINIC_OR_DEPARTMENT_OTHER): Payer: Medicaid Other

## 2019-04-03 ENCOUNTER — Encounter (HOSPITAL_BASED_OUTPATIENT_CLINIC_OR_DEPARTMENT_OTHER): Payer: Self-pay

## 2019-04-03 ENCOUNTER — Other Ambulatory Visit: Payer: Self-pay

## 2019-04-03 DIAGNOSIS — Y9389 Activity, other specified: Secondary | ICD-10-CM | POA: Insufficient documentation

## 2019-04-03 DIAGNOSIS — Y990 Civilian activity done for income or pay: Secondary | ICD-10-CM | POA: Insufficient documentation

## 2019-04-03 DIAGNOSIS — M25512 Pain in left shoulder: Secondary | ICD-10-CM | POA: Insufficient documentation

## 2019-04-03 DIAGNOSIS — M25552 Pain in left hip: Secondary | ICD-10-CM | POA: Insufficient documentation

## 2019-04-03 DIAGNOSIS — W19XXXA Unspecified fall, initial encounter: Secondary | ICD-10-CM | POA: Insufficient documentation

## 2019-04-03 DIAGNOSIS — Z87891 Personal history of nicotine dependence: Secondary | ICD-10-CM | POA: Diagnosis not present

## 2019-04-03 DIAGNOSIS — Z79899 Other long term (current) drug therapy: Secondary | ICD-10-CM | POA: Diagnosis not present

## 2019-04-03 DIAGNOSIS — R55 Syncope and collapse: Secondary | ICD-10-CM | POA: Insufficient documentation

## 2019-04-03 DIAGNOSIS — Y9289 Other specified places as the place of occurrence of the external cause: Secondary | ICD-10-CM | POA: Insufficient documentation

## 2019-04-03 DIAGNOSIS — R51 Headache: Secondary | ICD-10-CM | POA: Diagnosis not present

## 2019-04-03 DIAGNOSIS — M542 Cervicalgia: Secondary | ICD-10-CM | POA: Diagnosis present

## 2019-04-03 LAB — BASIC METABOLIC PANEL
Anion gap: 9 (ref 5–15)
BUN: 9 mg/dL (ref 6–20)
CO2: 22 mmol/L (ref 22–32)
Calcium: 8.7 mg/dL — ABNORMAL LOW (ref 8.9–10.3)
Chloride: 106 mmol/L (ref 98–111)
Creatinine, Ser: 1.03 mg/dL — ABNORMAL HIGH (ref 0.44–1.00)
GFR calc Af Amer: >60 mL/min (ref >60)
GFR calc non Af Amer: >60 mL/min (ref >60)
Glucose, Bld: 116 mg/dL — ABNORMAL HIGH (ref 70–99)
Potassium: 3.7 mmol/L (ref 3.5–5.1)
Sodium: 137 mmol/L (ref 135–145)

## 2019-04-03 LAB — URINALYSIS, ROUTINE W REFLEX MICROSCOPIC
Bilirubin Urine: NEGATIVE
Glucose, UA: NEGATIVE mg/dL
Hgb urine dipstick: NEGATIVE
Ketones, ur: NEGATIVE mg/dL
Leukocytes,Ua: NEGATIVE
Nitrite: NEGATIVE
Protein, ur: NEGATIVE mg/dL
Specific Gravity, Urine: 1.01 (ref 1.005–1.030)
pH: 6.5 (ref 5.0–8.0)

## 2019-04-03 LAB — CBC WITH DIFFERENTIAL/PLATELET
Abs Immature Granulocytes: 0.01 10*3/uL (ref 0.00–0.07)
Basophils Absolute: 0 10*3/uL (ref 0.0–0.1)
Basophils Relative: 1 %
Eosinophils Absolute: 0.1 10*3/uL (ref 0.0–0.5)
Eosinophils Relative: 1 %
HCT: 38 % (ref 36.0–46.0)
Hemoglobin: 11.5 g/dL — ABNORMAL LOW (ref 12.0–15.0)
Immature Granulocytes: 0 %
Lymphocytes Relative: 33 %
Lymphs Abs: 1.9 10*3/uL (ref 0.7–4.0)
MCH: 27.4 pg (ref 26.0–34.0)
MCHC: 30.3 g/dL (ref 30.0–36.0)
MCV: 90.7 fL (ref 80.0–100.0)
Monocytes Absolute: 0.6 10*3/uL (ref 0.1–1.0)
Monocytes Relative: 10 %
Neutro Abs: 3.3 10*3/uL (ref 1.7–7.7)
Neutrophils Relative %: 55 %
Platelets: 380 10*3/uL (ref 150–400)
RBC: 4.19 MIL/uL (ref 3.87–5.11)
RDW: 13.3 % (ref 11.5–15.5)
WBC: 5.9 10*3/uL (ref 4.0–10.5)
nRBC: 0 % (ref 0.0–0.2)

## 2019-04-03 LAB — PREGNANCY, URINE: Preg Test, Ur: NEGATIVE

## 2019-04-03 MED ORDER — ACETAMINOPHEN 325 MG PO TABS
650.0000 mg | ORAL_TABLET | Freq: Once | ORAL | Status: AC
  Administered 2019-04-03: 650 mg via ORAL
  Filled 2019-04-03: qty 2

## 2019-04-03 MED ORDER — SODIUM CHLORIDE 0.9 % IV BOLUS
1000.0000 mL | Freq: Once | INTRAVENOUS | Status: AC
  Administered 2019-04-03: 1000 mL via INTRAVENOUS

## 2019-04-03 NOTE — ED Triage Notes (Signed)
Pt works at [employer], Proofreader, very warm.  Overheated today, walked out to get water, on way there was feeling dizzy.  Fall landing on left side.  ?LOC  Generalized left sided pain.  Able to get onto stretcher with assist.  Previous left ankle injury.  Chronic headache, worse today.  Possible that pt hit head, not taking blood thinner.  Alert, oriented following fall.  Arrives in c-collar. Reports neck pain.  IV 20 Rt AC.  246ml NS infused on arrival.

## 2019-04-03 NOTE — ED Notes (Signed)
ED Provider at bedside. C-Collar removed by Dr. Ronnald Nian
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
## 2019-04-03 NOTE — ED Notes (Signed)
Pt aware of need for urine specimen.  IV fluids infusing

## 2019-04-03 NOTE — ED Notes (Signed)
Patient transported to X-ray 

## 2019-04-03 NOTE — ED Notes (Signed)
Radiology awaiting results of urine pregnancy prior to imaging of pelvis and hip per protocol

## 2019-06-03 ENCOUNTER — Encounter (HOSPITAL_COMMUNITY): Payer: Self-pay

## 2019-06-03 ENCOUNTER — Emergency Department (HOSPITAL_COMMUNITY): Payer: Medicaid Other

## 2019-06-03 ENCOUNTER — Other Ambulatory Visit: Payer: Self-pay

## 2019-06-03 ENCOUNTER — Emergency Department (HOSPITAL_COMMUNITY)
Admission: EM | Admit: 2019-06-03 | Discharge: 2019-06-04 | Disposition: A | Discharge status: Home / Self Care | Payer: Medicaid Other | Attending: Emergency Medicine | Admitting: Emergency Medicine

## 2019-06-03 DIAGNOSIS — Y92410 Unspecified street and highway as the place of occurrence of the external cause: Secondary | ICD-10-CM | POA: Insufficient documentation

## 2019-06-03 DIAGNOSIS — M7918 Myalgia, other site: Secondary | ICD-10-CM

## 2019-06-03 DIAGNOSIS — Z79899 Other long term (current) drug therapy: Secondary | ICD-10-CM | POA: Diagnosis not present

## 2019-06-03 DIAGNOSIS — R109 Unspecified abdominal pain: Secondary | ICD-10-CM | POA: Insufficient documentation

## 2019-06-03 DIAGNOSIS — M542 Cervicalgia: Secondary | ICD-10-CM | POA: Insufficient documentation

## 2019-06-03 DIAGNOSIS — Y999 Unspecified external cause status: Secondary | ICD-10-CM | POA: Diagnosis not present

## 2019-06-03 DIAGNOSIS — R0789 Other chest pain: Secondary | ICD-10-CM | POA: Insufficient documentation

## 2019-06-03 DIAGNOSIS — Y93I9 Activity, other involving external motion: Secondary | ICD-10-CM | POA: Diagnosis not present

## 2019-06-03 DIAGNOSIS — Z87891 Personal history of nicotine dependence: Secondary | ICD-10-CM | POA: Diagnosis not present

## 2019-06-03 DIAGNOSIS — M791 Myalgia, unspecified site: Secondary | ICD-10-CM | POA: Diagnosis not present

## 2019-06-03 DIAGNOSIS — R51 Headache: Secondary | ICD-10-CM | POA: Diagnosis present

## 2019-06-03 LAB — URINALYSIS, ROUTINE W REFLEX MICROSCOPIC
Bilirubin Urine: NEGATIVE
Glucose, UA: NEGATIVE mg/dL
Hgb urine dipstick: NEGATIVE
Ketones, ur: NEGATIVE mg/dL
Leukocytes,Ua: NEGATIVE
Nitrite: NEGATIVE
Protein, ur: NEGATIVE mg/dL
Specific Gravity, Urine: 1.017 (ref 1.005–1.030)
pH: 7 (ref 5.0–8.0)

## 2019-06-03 LAB — COMPREHENSIVE METABOLIC PANEL
ALT: 29 U/L (ref 0–44)
AST: 18 U/L (ref 15–41)
Albumin: 4.2 g/dL (ref 3.5–5.0)
Alkaline Phosphatase: 60 U/L (ref 38–126)
Anion gap: 9 (ref 5–15)
BUN: 15 mg/dL (ref 6–20)
CO2: 24 mmol/L (ref 22–32)
Calcium: 8.8 mg/dL — ABNORMAL LOW (ref 8.9–10.3)
Chloride: 104 mmol/L (ref 98–111)
Creatinine, Ser: 0.89 mg/dL (ref 0.44–1.00)
GFR calc Af Amer: >60 mL/min (ref >60)
GFR calc non Af Amer: >60 mL/min (ref >60)
Glucose, Bld: 96 mg/dL (ref 70–99)
Potassium: 3.7 mmol/L (ref 3.5–5.1)
Sodium: 137 mmol/L (ref 135–145)
Total Bilirubin: 0.2 mg/dL — ABNORMAL LOW (ref 0.3–1.2)
Total Protein: 7.4 g/dL (ref 6.5–8.1)

## 2019-06-03 LAB — CBC
HCT: 40.2 % (ref 36.0–46.0)
Hemoglobin: 12.7 g/dL (ref 12.0–15.0)
MCH: 29.1 pg (ref 26.0–34.0)
MCHC: 31.6 g/dL (ref 30.0–36.0)
MCV: 92 fL (ref 80.0–100.0)
Platelets: 352 10*3/uL (ref 150–400)
RBC: 4.37 MIL/uL (ref 3.87–5.11)
RDW: 13.4 % (ref 11.5–15.5)
WBC: 8.8 10*3/uL (ref 4.0–10.5)
nRBC: 0 % (ref 0.0–0.2)

## 2019-06-03 LAB — RAPID URINE DRUG SCREEN, HOSP PERFORMED
Amphetamines: NOT DETECTED
Barbiturates: NOT DETECTED
Benzodiazepines: NOT DETECTED
Cocaine: NOT DETECTED
Opiates: NOT DETECTED
Tetrahydrocannabinol: POSITIVE — AB

## 2019-06-03 LAB — ETHANOL: Alcohol, Ethyl (B): <10 mg/dL (ref <10)

## 2019-06-03 LAB — PROTIME-INR
INR: 1 (ref 0.8–1.2)
Prothrombin Time: 12.7 seconds (ref 11.4–15.2)

## 2019-06-03 LAB — I-STAT BETA HCG BLOOD, ED (MC, WL, AP ONLY): I-stat hCG, quantitative: <5 m[IU]/mL (ref <5)

## 2019-06-03 MED ORDER — SODIUM CHLORIDE 0.9 % IV BOLUS
1000.0000 mL | Freq: Once | INTRAVENOUS | Status: AC
  Administered 2019-06-03: 1000 mL via INTRAVENOUS

## 2019-06-03 MED ORDER — KETOROLAC TROMETHAMINE 30 MG/ML IJ SOLN
30.0000 mg | Freq: Once | INTRAMUSCULAR | Status: AC
  Administered 2019-06-03: 23:00:00 30 mg via INTRAVENOUS
  Filled 2019-06-03: qty 1

## 2019-06-03 MED ORDER — METHOCARBAMOL 500 MG PO TABS
500.0000 mg | ORAL_TABLET | Freq: Once | ORAL | Status: AC
  Administered 2019-06-03: 500 mg via ORAL
  Filled 2019-06-03: qty 1

## 2019-06-03 MED ORDER — FENTANYL CITRATE (PF) 100 MCG/2ML IJ SOLN
50.0000 ug | Freq: Once | INTRAMUSCULAR | Status: AC
  Administered 2019-06-03: 50 ug via INTRAVENOUS
  Filled 2019-06-03: qty 2

## 2019-06-03 MED ORDER — SODIUM CHLORIDE (PF) 0.9 % IJ SOLN
INTRAMUSCULAR | Status: AC
  Administered 2019-06-03: 21:00:00
  Filled 2019-06-03: qty 50

## 2019-06-03 MED ORDER — IOHEXOL 300 MG/ML  SOLN
100.0000 mL | Freq: Once | INTRAMUSCULAR | Status: AC | PRN
  Administered 2019-06-03: 21:00:00 100 mL via INTRAVENOUS

## 2019-06-03 MED ORDER — ONDANSETRON HCL 4 MG/2ML IJ SOLN
4.0000 mg | Freq: Once | INTRAMUSCULAR | Status: AC
  Administered 2019-06-03: 4 mg via INTRAVENOUS
  Filled 2019-06-03: qty 2

## 2019-06-03 NOTE — ED Notes (Signed)
Patient transported to CT 

## 2019-06-03 NOTE — ED Notes (Signed)
Pt placed on purewick on BR needs.
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
## 2019-06-04 MED ORDER — HYDROCODONE-ACETAMINOPHEN 5-325 MG PO TABS: 1.0000 | ORAL_TABLET | Freq: Four times a day (QID) | ORAL | 0 refills | Status: DC | PRN

## 2019-06-04 MED ORDER — METHOCARBAMOL 500 MG PO TABS: 500.0000 mg | ORAL_TABLET | Freq: Two times a day (BID) | ORAL | 0 refills | Status: DC

## 2019-06-04 MED ORDER — IBUPROFEN 600 MG PO TABS: 600.0000 mg | ORAL_TABLET | Freq: Four times a day (QID) | ORAL | 0 refills | Status: DC | PRN

## 2019-06-04 NOTE — Discharge Instructions (Signed)
Your imaging has been very reassuring.  The pain you are experiencing is likely due to muscle strain and contusion, you may take Ibuprofen or Naprosyn and Robaxin as needed for pain management. Take Norco for breakthrough pain. Do not combine with any pain reliever other than tylenol.  You may also use ice and heat, and over-the-counter remedies such as Biofreeze gel or salon pas lidocaine patches. The muscle soreness should improve over the next week. Follow up with your family doctor in the next week for a recheck if you are still having symptoms. Return to ED if pain is worsening, you develop weakness or numbness of extremities, or new or concerning symptoms develop.

## 2019-06-04 NOTE — ED Notes (Signed)
Discharge instructions reviewed with patient. Opportunity for questions and concerns allowed with none voiced. Patient stable upon discharge.

## 2019-06-06 ENCOUNTER — Ambulatory Visit (HOSPITAL_COMMUNITY)
Admission: EM | Admit: 2019-06-06 | Discharge: 2019-06-06 | Disposition: A | Discharge status: Home / Self Care | Payer: Medicaid Other

## 2019-06-06 ENCOUNTER — Encounter (HOSPITAL_COMMUNITY): Payer: Self-pay

## 2019-06-06 DIAGNOSIS — T07XXXD Unspecified multiple injuries, subsequent encounter: Secondary | ICD-10-CM

## 2019-06-06 DIAGNOSIS — S40022D Contusion of left upper arm, subsequent encounter: Secondary | ICD-10-CM

## 2019-06-06 DIAGNOSIS — T07XXXA Unspecified multiple injuries, initial encounter: Secondary | ICD-10-CM

## 2019-06-06 NOTE — Discharge Instructions (Signed)
Continue to take the medication prescribed for you in the emergency department as directed.    Follow-up with your primary care provider or an orthopedist if your pain continues.    Wear the sling as needed for comfort.  Use ice packs as we discussed 2-3 times a day for up to 15 to 20 minutes each time.
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
## 2019-06-08 ENCOUNTER — Ambulatory Visit: Payer: Medicaid Other | Admitting: Orthopaedic Surgery

## 2019-06-13 ENCOUNTER — Ambulatory Visit (INDEPENDENT_AMBULATORY_CARE_PROVIDER_SITE_OTHER): Payer: Medicaid Other | Admitting: Orthopaedic Surgery

## 2019-06-13 ENCOUNTER — Encounter: Payer: Self-pay | Admitting: Orthopaedic Surgery

## 2019-06-13 DIAGNOSIS — M25572 Pain in left ankle and joints of left foot: Secondary | ICD-10-CM
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
## 2019-06-19 ENCOUNTER — Ambulatory Visit: Payer: No Typology Code available for payment source | Admitting: Physical Therapy

## 2019-06-27 ENCOUNTER — Ambulatory Visit: Payer: No Typology Code available for payment source | Attending: Neurosurgery | Admitting: Physical Therapy

## 2019-07-26 ENCOUNTER — Telehealth: Payer: Self-pay | Admitting: Orthopaedic Surgery

## 2019-07-26 NOTE — Telephone Encounter (Signed)
Patient came by office this morning stating she lost her copy of records that she picked up several months ago. I printed her another copy.

## 2019-08-21 ENCOUNTER — Other Ambulatory Visit: Payer: Self-pay

## 2019-08-21 ENCOUNTER — Encounter (HOSPITAL_COMMUNITY): Payer: Self-pay

## 2019-08-21 ENCOUNTER — Emergency Department (HOSPITAL_COMMUNITY): Payer: Medicaid Other

## 2019-08-21 ENCOUNTER — Emergency Department (HOSPITAL_COMMUNITY)
Admission: EM | Admit: 2019-08-21 | Discharge: 2019-08-21 | Disposition: A | Discharge status: Home / Self Care | Payer: Medicaid Other | Attending: Emergency Medicine | Admitting: Emergency Medicine

## 2019-08-21 DIAGNOSIS — N760 Acute vaginitis: Secondary | ICD-10-CM | POA: Insufficient documentation

## 2019-08-21 DIAGNOSIS — Z79899 Other long term (current) drug therapy: Secondary | ICD-10-CM | POA: Diagnosis not present

## 2019-08-21 DIAGNOSIS — M25572 Pain in left ankle and joints of left foot: Secondary | ICD-10-CM | POA: Diagnosis not present

## 2019-08-21 DIAGNOSIS — Z87891 Personal history of nicotine dependence: Secondary | ICD-10-CM | POA: Diagnosis not present

## 2019-08-21 DIAGNOSIS — B9689 Other specified bacterial agents as the cause of diseases classified elsewhere: Secondary | ICD-10-CM

## 2019-08-21 LAB — URINALYSIS, ROUTINE W REFLEX MICROSCOPIC
Bilirubin Urine: NEGATIVE
Glucose, UA: NEGATIVE mg/dL
Hgb urine dipstick: NEGATIVE
Ketones, ur: NEGATIVE mg/dL
Leukocytes,Ua: NEGATIVE
Nitrite: NEGATIVE
Protein, ur: NEGATIVE mg/dL
Specific Gravity, Urine: 1.005 (ref 1.005–1.030)
pH: 7 (ref 5.0–8.0)

## 2019-08-21 LAB — WET PREP, GENITAL
Sperm: NONE SEEN
Trich, Wet Prep: NONE SEEN
Yeast Wet Prep HPF POC: NONE SEEN

## 2019-08-21 MED ORDER — KETOROLAC TROMETHAMINE 30 MG/ML IJ SOLN
30.0000 mg | Freq: Once | INTRAMUSCULAR | Status: AC
  Administered 2019-08-21: 15:00:00 30 mg via INTRAMUSCULAR
  Filled 2019-08-21: qty 1

## 2019-08-21 MED ORDER — METRONIDAZOLE 500 MG PO TABS: 500.0000 mg | ORAL_TABLET | Freq: Two times a day (BID) | ORAL | 0 refills | Status: AC
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
## 2019-08-21 NOTE — ED Triage Notes (Signed)
Patient complains of left ankle after twisting same today. Also request STD check for discharge for several days

## 2019-08-21 NOTE — ED Notes (Signed)
Pt discharge instructions and prescriptions reviewed with the patient. The patient verbalized understanding of both. Pt discharged. 

## 2019-08-21 NOTE — Discharge Instructions (Addendum)
Your xray today was normal.  Your Wet prep was positive for BV, I have scribed antibiotics, please take 1 tablet twice a day for the next 7 days.  Please be aware this medication can cause vomiting if taken with alcohol, do not drink alcohol while taking this medication.

## 2019-08-22 LAB — GC/CHLAMYDIA PROBE AMP (~~LOC~~) NOT AT ARMC
Chlamydia: NEGATIVE
Neisseria Gonorrhea: NEGATIVE

## 2019-09-04 ENCOUNTER — Encounter (HOSPITAL_COMMUNITY): Payer: Self-pay | Admitting: *Deleted

## 2019-09-04 ENCOUNTER — Emergency Department (HOSPITAL_COMMUNITY)
Admission: EM | Admit: 2019-09-04 | Discharge: 2019-09-04 | Disposition: A | Discharge status: Home / Self Care | Payer: Medicaid Other | Attending: Emergency Medicine | Admitting: Emergency Medicine

## 2019-09-04 DIAGNOSIS — B3731 Acute candidiasis of vulva and vagina: Secondary | ICD-10-CM

## 2019-09-04 DIAGNOSIS — B373 Candidiasis of vulva and vagina: Secondary | ICD-10-CM | POA: Diagnosis not present

## 2019-09-04 DIAGNOSIS — Z87891 Personal history of nicotine dependence: Secondary | ICD-10-CM | POA: Diagnosis not present

## 2019-09-04 DIAGNOSIS — Z202 Contact with and (suspected) exposure to infections with a predominantly sexual mode of transmission: Secondary | ICD-10-CM | POA: Insufficient documentation

## 2019-09-04 DIAGNOSIS — R102 Pelvic and perineal pain: Secondary | ICD-10-CM | POA: Diagnosis present

## 2019-09-04 DIAGNOSIS — Z711 Person with feared health complaint in whom no diagnosis is made: Secondary | ICD-10-CM

## 2019-09-04 DIAGNOSIS — Z79899 Other long term (current) drug therapy: Secondary | ICD-10-CM | POA: Diagnosis not present

## 2019-09-04 LAB — HIV ANTIBODY (ROUTINE TESTING W REFLEX): HIV Screen 4th Generation wRfx: NONREACTIVE

## 2019-09-04 LAB — URINALYSIS, ROUTINE W REFLEX MICROSCOPIC
Bacteria, UA: NONE SEEN
Bilirubin Urine: NEGATIVE
Glucose, UA: NEGATIVE mg/dL
Hgb urine dipstick: NEGATIVE
Ketones, ur: NEGATIVE mg/dL
Nitrite: NEGATIVE
Protein, ur: NEGATIVE mg/dL
Specific Gravity, Urine: 1.023 (ref 1.005–1.030)
pH: 5 (ref 5.0–8.0)

## 2019-09-04 LAB — WET PREP, GENITAL
Clue Cells Wet Prep HPF POC: NONE SEEN
Sperm: NONE SEEN
Trich, Wet Prep: NONE SEEN

## 2019-09-04 LAB — I-STAT BETA HCG BLOOD, ED (MC, WL, AP ONLY): I-stat hCG, quantitative: <5 m[IU]/mL (ref <5)

## 2019-09-04 MED ORDER — CEFTRIAXONE SODIUM 250 MG IJ SOLR
250.0000 mg | Freq: Once | INTRAMUSCULAR | Status: AC
  Administered 2019-09-04: 250 mg via INTRAMUSCULAR
  Filled 2019-09-04: qty 250

## 2019-09-04 MED ORDER — FLUCONAZOLE 150 MG PO TABS
150.0000 mg | ORAL_TABLET | Freq: Once | ORAL | Status: AC
  Administered 2019-09-04: 150 mg via ORAL
  Filled 2019-09-04: qty 1

## 2019-09-04 MED ORDER — FLUCONAZOLE 200 MG PO TABS: 200.0000 mg | ORAL_TABLET | Freq: Every day | ORAL | 0 refills | Status: DC

## 2019-09-04 MED ORDER — AZITHROMYCIN 250 MG PO TABS
1000.0000 mg | ORAL_TABLET | Freq: Once | ORAL | Status: AC
  Administered 2019-09-04: 1000 mg via ORAL
  Filled 2019-09-04: qty 4

## 2019-09-04 MED ORDER — LIDOCAINE HCL (PF) 1 % IJ SOLN
INTRAMUSCULAR | Status: AC
  Administered 2019-09-04: 0.9 mL
  Filled 2019-09-04: qty 5
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
## 2019-09-04 NOTE — ED Notes (Signed)
urine culture is on lab already

## 2019-09-04 NOTE — ED Triage Notes (Signed)
Pt woke up this am with vaginal burning. She states that she had a wet prep last week but states that she feels that she has a STD and would like antibiotics.  Pt states that she has continuous vaginal burning. Pt is tearful.

## 2019-09-04 NOTE — ED Notes (Signed)
Pt dc'd home w/allbelongings, a/o x4, no narcotics given in ED  

## 2019-09-04 NOTE — Discharge Instructions (Signed)
You will be called if your STD testing is positive.  You were given antibiotics in the emergency department for this.  I have also given you an antibiotic for a yeast infection.  If you still have vaginal discharge in 1 week you may take the additional tablet.  You may follow-up with the OB/GYN that I referred you to.  Please do not have sexual intercourse until you and your partner both have negative STD testing.

## 2019-09-05 ENCOUNTER — Emergency Department (HOSPITAL_COMMUNITY)
Admission: EM | Admit: 2019-09-05 | Discharge: 2019-09-06 | Disposition: A | Discharge status: Home / Self Care | Payer: Medicaid Other | Attending: Emergency Medicine | Admitting: Emergency Medicine

## 2019-09-05 ENCOUNTER — Encounter (HOSPITAL_COMMUNITY): Payer: Self-pay

## 2019-09-05 ENCOUNTER — Other Ambulatory Visit: Payer: Self-pay

## 2019-09-05 DIAGNOSIS — R45850 Homicidal ideations: Secondary | ICD-10-CM | POA: Insufficient documentation

## 2019-09-05 DIAGNOSIS — F19959 Other psychoactive substance use, unspecified with psychoactive substance-induced psychotic disorder, unspecified: Secondary | ICD-10-CM | POA: Diagnosis present

## 2019-09-05 DIAGNOSIS — F209 Schizophrenia, unspecified: Secondary | ICD-10-CM | POA: Diagnosis present

## 2019-09-05 DIAGNOSIS — R45851 Suicidal ideations: Secondary | ICD-10-CM | POA: Insufficient documentation

## 2019-09-05 DIAGNOSIS — Z79899 Other long term (current) drug therapy: Secondary | ICD-10-CM | POA: Diagnosis not present

## 2019-09-05 DIAGNOSIS — Z20828 Contact with and (suspected) exposure to other viral communicable diseases: Secondary | ICD-10-CM | POA: Diagnosis not present

## 2019-09-05 DIAGNOSIS — Z87891 Personal history of nicotine dependence: Secondary | ICD-10-CM | POA: Insufficient documentation

## 2019-09-05 HISTORY — DX: Schizophrenia, unspecified: F20.9

## 2019-09-05 LAB — RAPID URINE DRUG SCREEN, HOSP PERFORMED
Amphetamines: NOT DETECTED
Barbiturates: NOT DETECTED
Benzodiazepines: NOT DETECTED
Cocaine: POSITIVE — AB
Opiates: NOT DETECTED
Tetrahydrocannabinol: NOT DETECTED

## 2019-09-05 LAB — CBC WITH DIFFERENTIAL/PLATELET
Abs Immature Granulocytes: 0.02 10*3/uL (ref 0.00–0.07)
Basophils Absolute: 0.1 10*3/uL (ref 0.0–0.1)
Basophils Relative: 1 %
Eosinophils Absolute: 0.1 10*3/uL (ref 0.0–0.5)
Eosinophils Relative: 1 %
HCT: 40.9 % (ref 36.0–46.0)
Hemoglobin: 12.5 g/dL (ref 12.0–15.0)
Immature Granulocytes: 0 %
Lymphocytes Relative: 28 %
Lymphs Abs: 2.3 10*3/uL (ref 0.7–4.0)
MCH: 28.2 pg (ref 26.0–34.0)
MCHC: 30.6 g/dL (ref 30.0–36.0)
MCV: 92.1 fL (ref 80.0–100.0)
Monocytes Absolute: 0.8 10*3/uL (ref 0.1–1.0)
Monocytes Relative: 10 %
Neutro Abs: 4.9 10*3/uL (ref 1.7–7.7)
Neutrophils Relative %: 60 %
Platelets: 402 10*3/uL — ABNORMAL HIGH (ref 150–400)
RBC: 4.44 MIL/uL (ref 3.87–5.11)
RDW: 13.9 % (ref 11.5–15.5)
WBC: 8.2 10*3/uL (ref 4.0–10.5)
nRBC: 0 % (ref 0.0–0.2)

## 2019-09-05 LAB — COMPREHENSIVE METABOLIC PANEL
ALT: 44 U/L (ref 0–44)
AST: 22 U/L (ref 15–41)
Albumin: 4.2 g/dL (ref 3.5–5.0)
Alkaline Phosphatase: 52 U/L (ref 38–126)
Anion gap: 10 (ref 5–15)
BUN: 9 mg/dL (ref 6–20)
CO2: 28 mmol/L (ref 22–32)
Calcium: 9.2 mg/dL (ref 8.9–10.3)
Chloride: 96 mmol/L — ABNORMAL LOW (ref 98–111)
Creatinine, Ser: 0.94 mg/dL (ref 0.44–1.00)
GFR calc Af Amer: >60 mL/min (ref >60)
GFR calc non Af Amer: >60 mL/min (ref >60)
Glucose, Bld: 133 mg/dL — ABNORMAL HIGH (ref 70–99)
Potassium: 3.6 mmol/L (ref 3.5–5.1)
Sodium: 134 mmol/L — ABNORMAL LOW (ref 135–145)
Total Bilirubin: 0.4 mg/dL (ref 0.3–1.2)
Total Protein: 8 g/dL (ref 6.5–8.1)

## 2019-09-05 LAB — GC/CHLAMYDIA PROBE AMP (~~LOC~~) NOT AT ARMC
Chlamydia: NEGATIVE
Neisseria Gonorrhea: NEGATIVE

## 2019-09-05 LAB — URINE CULTURE: Culture: 80000 — AB

## 2019-09-05 LAB — I-STAT BETA HCG BLOOD, ED (MC, WL, AP ONLY): I-stat hCG, quantitative: <5 m[IU]/mL (ref <5)

## 2019-09-05 LAB — ETHANOL: Alcohol, Ethyl (B): 11 mg/dL — ABNORMAL HIGH (ref <10)

## 2019-09-05 LAB — RPR: RPR Ser Ql: NONREACTIVE

## 2019-09-05 MED ORDER — ACETAMINOPHEN 325 MG PO TABS: 650.0000 mg | ORAL_TABLET | ORAL | Status: DC | PRN

## 2019-09-05 MED ORDER — ALUM & MAG HYDROXIDE-SIMETH 200-200-20 MG/5ML PO SUSP: 30.0000 mL | Freq: Four times a day (QID) | ORAL | Status: DC | PRN

## 2019-09-05 NOTE — ED Notes (Signed)
Pt. Made aware for the need of urine specimen. Pt. Unable to give urine specimen at this time. Pt. Stated she needed more water so that she can give [place] a urine specimen. RN,Joanne made aware.

## 2019-09-05 NOTE — Progress Notes (Signed)
Received Nicole Manning from triage, she was changed out into scrubs. She stated she is here because she was  feeling suicidal and homicidal. Her ACT team told her to come to the hospital to stay safe. She slept throughout the night and the sitter remained at the bedside.
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
## 2019-09-06 ENCOUNTER — Telehealth: Payer: Self-pay

## 2019-09-06 DIAGNOSIS — F19959 Other psychoactive substance use, unspecified with psychoactive substance-induced psychotic disorder, unspecified: Secondary | ICD-10-CM

## 2019-09-06 LAB — RESPIRATORY PANEL BY RT PCR (FLU A&B, COVID)
Influenza A by PCR: NEGATIVE
Influenza B by PCR: NEGATIVE
SARS Coronavirus 2 by RT PCR: NEGATIVE

## 2019-09-06 MED ORDER — TOPIRAMATE 25 MG PO TABS
50.0000 mg | ORAL_TABLET | Freq: Two times a day (BID) | ORAL | Status: DC
  Administered 2019-09-06 (×2): 50 mg via ORAL
  Filled 2019-09-06 (×3): qty 2

## 2019-09-06 MED ORDER — TOPIRAMATE 25 MG PO TABS: 25.0000 mg | ORAL_TABLET | Freq: Every day | ORAL | Status: DC
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
## 2019-09-06 NOTE — Progress Notes (Signed)
CSW spoke with patient regarding transportation needs. Patient initally reported to TTS team that she wanted to go back to [place], [place], but patient reports she needs to get better and stay here. She also reports she wants to stay to be able to see her son. Patient reports she lives at the [facility]. She also has an ACT team. CSW received verbal consent to contact patient's ACT Team. CSW spoke with the ACT Team and they report they are aware that patient is in the hospital and states she can go back to [facility]. CSW to assist patient with getting back to [facility] via [facility].   Golden Circle, LCSW Transitions of Care Department [facility] ED [phone]

## 2019-09-06 NOTE — Discharge Instructions (Signed)
For your mental health needs, you are advised to continue treatment the with [facility] ACT Team:       [facility]      [address]      [place], [place] [address]      [phone]      Crisis number: [phone]

## 2019-09-06 NOTE — ED Notes (Signed)
Pt discharged safely with discharge instructions.  All belongings were returned to patient. 
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
## 2019-09-06 NOTE — BH Assessment (Signed)
[place] Assessment Progress Note  Per Hampton Abbot, MD, this pt does not require psychiatric hospitalization at this time.  Pt is to be discharged from [facility] with recommendation to continue treatment with the [facility] Team.  This has been included in pt's discharge instructions.  Pt will reportedly need transportation assistance to go to [place], [place].  A social work consult will be ordered for her, and this Probation officer has called [place], CSW to inform her.  Pt's nurse, Nena Jordan, has been notified.  Jalene Mullet, [place] Triage Specialist [phone]
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
## 2019-09-06 NOTE — Telephone Encounter (Signed)
Post ED Visit - Positive Culture Follow-up  Culture report reviewed by antimicrobial stewardship pharmacist: [place] Team []  [address], Pharm.D. []  Heide Guile, Pharm.D., BCPS AQ-ID []  Parks Neptune, Pharm.D., BCPS []  Alycia Rossetti, Pharm.D., BCPS []  [place], Pharm.D., BCPS, AAHIVP []  Legrand Como, Pharm.D., BCPS, AAHIVP []  Salome Arnt, PharmD, BCPS []  Johnnette Gourd, PharmD, BCPS []  Hughes Better, PharmD, BCPS []  Leeroy Cha, PharmD []  Laqueta Linden, PharmD, BCPS []  Albertina Parr, PharmD [place] Team []  Leodis Sias, PharmD []  Lindell Spar, PharmD []  Royetta Asal, PharmD []  Graylin Shiver, Rph []  Rema Fendt) Glennon Mac, PharmD []  Arlyn Dunning, PharmD []  Netta Cedars, PharmD []  Dia Sitter, PharmD []  Leone Haven, PharmD []  Gretta Arab, PharmD []  Theodis Shove, PharmD []  Peggyann Juba, PharmD []  Reuel Boom, PharmD   Positive urine culture  and no further patient follow-up is required at this time.  Genia Del 09/06/2019, 12:27 PM

## 2019-09-20 ENCOUNTER — Encounter: Payer: Self-pay | Admitting: Orthopaedic Surgery

## 2019-09-20 ENCOUNTER — Other Ambulatory Visit: Payer: Self-pay

## 2019-09-20 ENCOUNTER — Ambulatory Visit (INDEPENDENT_AMBULATORY_CARE_PROVIDER_SITE_OTHER): Payer: Medicaid Other | Admitting: Orthopaedic Surgery

## 2019-09-20 DIAGNOSIS — M19172 Post-traumatic osteoarthritis, left ankle and foot: Secondary | ICD-10-CM | POA: Diagnosis not present
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
## 2019-09-25 ENCOUNTER — Emergency Department (HOSPITAL_COMMUNITY): Payer: Medicaid Other

## 2019-09-25 ENCOUNTER — Other Ambulatory Visit: Payer: Self-pay

## 2019-09-25 ENCOUNTER — Emergency Department (HOSPITAL_COMMUNITY)
Admission: EM | Admit: 2019-09-25 | Discharge: 2019-09-25 | Disposition: A | Discharge status: Home / Self Care | Payer: Medicaid Other | Attending: Emergency Medicine | Admitting: Emergency Medicine

## 2019-09-25 DIAGNOSIS — S86911A Strain of unspecified muscle(s) and tendon(s) at lower leg level, right leg, initial encounter: Secondary | ICD-10-CM

## 2019-09-25 DIAGNOSIS — Y939 Activity, unspecified: Secondary | ICD-10-CM | POA: Insufficient documentation

## 2019-09-25 DIAGNOSIS — Y929 Unspecified place or not applicable: Secondary | ICD-10-CM | POA: Diagnosis not present

## 2019-09-25 DIAGNOSIS — S93492A Sprain of other ligament of left ankle, initial encounter: Secondary | ICD-10-CM

## 2019-09-25 DIAGNOSIS — Z87891 Personal history of nicotine dependence: Secondary | ICD-10-CM | POA: Diagnosis not present

## 2019-09-25 DIAGNOSIS — F209 Schizophrenia, unspecified: Secondary | ICD-10-CM | POA: Diagnosis not present

## 2019-09-25 DIAGNOSIS — Z79899 Other long term (current) drug therapy: Secondary | ICD-10-CM | POA: Insufficient documentation

## 2019-09-25 DIAGNOSIS — W1830XA Fall on same level, unspecified, initial encounter: Secondary | ICD-10-CM | POA: Diagnosis not present

## 2019-09-25 DIAGNOSIS — Y999 Unspecified external cause status: Secondary | ICD-10-CM | POA: Insufficient documentation

## 2019-09-25 DIAGNOSIS — S8991XA Unspecified injury of right lower leg, initial encounter: Secondary | ICD-10-CM | POA: Diagnosis present

## 2019-09-25 MED ORDER — HYDROCODONE-ACETAMINOPHEN 5-325 MG PO TABS: 2.0000 | ORAL_TABLET | Freq: Four times a day (QID) | ORAL | 0 refills | Status: DC | PRN

## 2019-09-25 MED ORDER — HYDROCODONE-ACETAMINOPHEN 5-325 MG PO TABS
1.0000 | ORAL_TABLET | Freq: Once | ORAL | Status: AC
  Administered 2019-09-25: 1 via ORAL
  Filled 2019-09-25: qty 1

## 2019-09-25 NOTE — ED Triage Notes (Signed)
Pt here for evaluation of R knee pain after fall yesterday. Swelling noted. Pt sts also having pain in L ankle, hx of fracture to same. No dizziness prior to fall, pt tripped and fell.
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
## 2019-09-25 NOTE — Progress Notes (Signed)
Orthopedic Tech Progress Note Patient Details:  Nicole Manning 11-Jan-1986 [id number]  Ortho Devices Type of Ortho Device: Lenora Boys splint Ortho Device/Splint Location: RLE Ortho Device/Splint Interventions: Application, Ordered   Post Interventions Patient Tolerated: Ambulated well, Well Instructions Provided: Care of device, Adjustment of device   [name] 09/25/2019, 5:29 PM

## 2019-09-25 NOTE — ED Notes (Signed)
Ortho tech at bedside 

## 2019-09-26 ENCOUNTER — Ambulatory Visit (INDEPENDENT_AMBULATORY_CARE_PROVIDER_SITE_OTHER): Payer: Medicaid Other | Admitting: Orthopedic Surgery

## 2019-09-26 ENCOUNTER — Encounter: Payer: Self-pay | Admitting: Orthopedic Surgery

## 2019-09-26 VITALS — Ht 61.0 in | Wt 212.0 lb

## 2019-09-26 DIAGNOSIS — M19172 Post-traumatic osteoarthritis, left ankle and foot: Secondary | ICD-10-CM

## 2019-09-26 DIAGNOSIS — M25561 Pain in right knee: Secondary | ICD-10-CM | POA: Diagnosis not present

## 2019-09-26 MED ORDER — METHYLPREDNISOLONE ACETATE 40 MG/ML IJ SUSP
40.0000 mg | INTRAMUSCULAR | Status: AC | PRN
  Administered 2019-09-26: 14:00:00 40 mg via INTRA_ARTICULAR

## 2019-09-26 MED ORDER — LIDOCAINE HCL (PF) 1 % IJ SOLN
5.0000 mL | INTRAMUSCULAR | Status: AC | PRN
  Administered 2019-09-26: 14:00:00 5 mL
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
## 2019-10-10 ENCOUNTER — Other Ambulatory Visit: Payer: Self-pay

## 2019-10-10 ENCOUNTER — Encounter: Payer: Self-pay | Admitting: Orthopaedic Surgery

## 2019-10-10 ENCOUNTER — Ambulatory Visit (INDEPENDENT_AMBULATORY_CARE_PROVIDER_SITE_OTHER): Payer: Medicaid Other | Admitting: Orthopaedic Surgery

## 2019-10-10 DIAGNOSIS — M25561 Pain in right knee: Secondary | ICD-10-CM

## 2019-10-10 MED ORDER — HYDROCODONE-ACETAMINOPHEN 5-325 MG PO TABS: 1.0000 | ORAL_TABLET | Freq: Every day | ORAL | 0 refills | Status: DC | PRN
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
## 2019-10-11 NOTE — Addendum Note (Signed)
Addended by: Albertina Parr on: 10/11/2019 10:49 AM   Modules accepted: Orders

## 2019-10-24 ENCOUNTER — Ambulatory Visit: Payer: Medicaid Other | Admitting: Orthopedic Surgery

## 2019-10-26 ENCOUNTER — Ambulatory Visit: Payer: Medicaid Other | Admitting: Orthopedic Surgery

## 2019-11-23 ENCOUNTER — Other Ambulatory Visit: Payer: Medicaid Other

## 2019-11-28 ENCOUNTER — Ambulatory Visit: Payer: Medicaid Other | Admitting: Orthopaedic Surgery

## 2019-12-11 ENCOUNTER — Ambulatory Visit
Admission: RE | Admit: 2019-12-11 | Discharge: 2019-12-11 | Disposition: A | Discharge status: Home / Self Care | Payer: Medicaid Other | Source: Ambulatory Visit | Attending: Physician Assistant | Admitting: Physician Assistant

## 2019-12-11 ENCOUNTER — Other Ambulatory Visit: Payer: Self-pay

## 2019-12-11 DIAGNOSIS — M25561 Pain in right knee: Secondary | ICD-10-CM

## 2019-12-12 NOTE — Progress Notes (Signed)
F/u to discuss

## 2019-12-13 ENCOUNTER — Ambulatory Visit (INDEPENDENT_AMBULATORY_CARE_PROVIDER_SITE_OTHER): Payer: Medicaid Other | Admitting: Orthopaedic Surgery

## 2019-12-13 ENCOUNTER — Encounter: Payer: Self-pay | Admitting: Orthopaedic Surgery

## 2019-12-13 ENCOUNTER — Other Ambulatory Visit: Payer: Self-pay

## 2019-12-13 DIAGNOSIS — M1711 Unilateral primary osteoarthritis, right knee: Secondary | ICD-10-CM | POA: Diagnosis not present

## 2019-12-13 MED ORDER — METHYLPREDNISOLONE ACETATE 40 MG/ML IJ SUSP
40.0000 mg | INTRAMUSCULAR | Status: AC | PRN
  Administered 2019-12-13: 40 mg via INTRA_ARTICULAR

## 2019-12-13 MED ORDER — LIDOCAINE HCL 1 % IJ SOLN
2.0000 mL | INTRAMUSCULAR | Status: AC | PRN
  Administered 2019-12-13: 13:00:00 2 mL

## 2019-12-13 MED ORDER — BUPIVACAINE HCL 0.5 % IJ SOLN
2.0000 mL | INTRAMUSCULAR | Status: AC | PRN
  Administered 2019-12-13: 2 mL via INTRA_ARTICULAR
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
## 2019-12-26 ENCOUNTER — Telehealth: Payer: Self-pay | Admitting: Orthopaedic Surgery

## 2019-12-26 NOTE — Telephone Encounter (Signed)
Asking if there is anything else she can get?

## 2019-12-26 NOTE — Telephone Encounter (Signed)
Tylenol #3 or tramadol.

## 2019-12-26 NOTE — Telephone Encounter (Signed)
Can't do hydrocodone.

## 2019-12-26 NOTE — Telephone Encounter (Signed)
Patient called.   She is requesting a refill on her Hydrocodone.   Call back: [phone]

## 2019-12-27 ENCOUNTER — Other Ambulatory Visit: Payer: Self-pay | Admitting: Physician Assistant

## 2019-12-27 MED ORDER — ACETAMINOPHEN-CODEINE #3 300-30 MG PO TABS: 1.0000 | ORAL_TABLET | Freq: Three times a day (TID) | ORAL | 0 refills | Status: DC | PRN

## 2019-12-27 NOTE — Telephone Encounter (Signed)
Sent in.  Could you also check and make sure she is not getting meds from pain management?

## 2019-12-27 NOTE — Telephone Encounter (Signed)
Would like Tylenol #3 called into [facility].

## 2019-12-29 ENCOUNTER — Other Ambulatory Visit: Payer: Self-pay

## 2019-12-29 DIAGNOSIS — M1711 Unilateral primary osteoarthritis, right knee: Secondary | ICD-10-CM

## 2019-12-29 DIAGNOSIS — M19172 Post-traumatic osteoarthritis, left ankle and foot: Secondary | ICD-10-CM

## 2019-12-29 NOTE — Telephone Encounter (Signed)
sure

## 2019-12-29 NOTE — Telephone Encounter (Signed)
Order made

## 2019-12-29 NOTE — Telephone Encounter (Signed)
Called patient states she is not going to pain clinic anymore. Had a drug test and she failed it. Would like to be referred to another pain clinic and start all over.  Okay to put in for new referral to new pain clinic?

## 2020-01-09 ENCOUNTER — Telehealth: Payer: Self-pay | Admitting: Orthopaedic Surgery

## 2020-01-09 NOTE — Telephone Encounter (Signed)
Patient called.   She was checking the status on a brace she was told she would receive.   Call back: [phone]

## 2020-01-09 NOTE — Telephone Encounter (Signed)
Have we heard about the brace. Any updates ? Has Alycia Rossetti ordered this?

## 2020-01-10 NOTE — Telephone Encounter (Signed)
Alycia Rossetti is on vacation this week. I would check with Samuel Bouche.

## 2020-01-11 NOTE — Telephone Encounter (Signed)
Patient aware brace is pending.

## 2020-01-16 ENCOUNTER — Other Ambulatory Visit: Payer: Self-pay

## 2020-01-16 ENCOUNTER — Ambulatory Visit: Payer: Medicaid Other

## 2020-04-19 ENCOUNTER — Other Ambulatory Visit: Payer: Self-pay | Admitting: Orthopaedic Surgery

## 2020-04-19 DIAGNOSIS — M19172 Post-traumatic osteoarthritis, left ankle and foot: Secondary | ICD-10-CM

## 2020-04-19 DIAGNOSIS — M1711 Unilateral primary osteoarthritis, right knee: Secondary | ICD-10-CM

## 2020-04-19 NOTE — Telephone Encounter (Signed)
Patient called.   She was dismissed from pain management but is still in a lot of pain. She wanted to know if she could be given more pain medicine or a referral to another clinic.   Call back: [phone]

## 2020-04-19 NOTE — Telephone Encounter (Signed)
Holding for Roda Shutters to address.

## 2020-04-22 NOTE — Addendum Note (Signed)
Addended by: Rogers Seeds on: 04/22/2020 03:26 PM   Modules accepted: Orders

## 2020-04-22 NOTE — Telephone Encounter (Signed)
Refer to pain clinic please.  Thanks.

## 2020-04-22 NOTE — Telephone Encounter (Signed)
Please advise 

## 2020-04-22 NOTE — Telephone Encounter (Signed)
Referral entered. I called patient and advised. 

## 2020-05-08 ENCOUNTER — Ambulatory Visit (INDEPENDENT_AMBULATORY_CARE_PROVIDER_SITE_OTHER): Payer: Medicaid Other | Admitting: Orthopaedic Surgery

## 2020-05-08 ENCOUNTER — Encounter: Payer: Self-pay | Admitting: Orthopaedic Surgery

## 2020-05-08 ENCOUNTER — Other Ambulatory Visit: Payer: Self-pay

## 2020-05-08 ENCOUNTER — Ambulatory Visit (INDEPENDENT_AMBULATORY_CARE_PROVIDER_SITE_OTHER): Payer: Medicaid Other

## 2020-05-08 VITALS — Ht 63.0 in | Wt 232.8 lb

## 2020-05-08 DIAGNOSIS — M19172 Post-traumatic osteoarthritis, left ankle and foot: Secondary | ICD-10-CM
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
## 2020-06-01 ENCOUNTER — Encounter (HOSPITAL_COMMUNITY): Payer: Self-pay | Admitting: Emergency Medicine

## 2020-06-01 ENCOUNTER — Other Ambulatory Visit: Payer: Self-pay

## 2020-06-01 ENCOUNTER — Emergency Department (HOSPITAL_COMMUNITY): Payer: Medicaid Other

## 2020-06-01 ENCOUNTER — Emergency Department (HOSPITAL_COMMUNITY)
Admission: EM | Admit: 2020-06-01 | Discharge: 2020-06-01 | Disposition: A | Discharge status: Home / Self Care | Payer: Medicaid Other | Attending: Emergency Medicine | Admitting: Emergency Medicine

## 2020-06-01 DIAGNOSIS — Z87891 Personal history of nicotine dependence: Secondary | ICD-10-CM | POA: Insufficient documentation

## 2020-06-01 DIAGNOSIS — R072 Precordial pain: Secondary | ICD-10-CM | POA: Insufficient documentation

## 2020-06-01 DIAGNOSIS — R0789 Other chest pain: Secondary | ICD-10-CM

## 2020-06-01 DIAGNOSIS — Z79899 Other long term (current) drug therapy: Secondary | ICD-10-CM | POA: Diagnosis not present

## 2020-06-01 LAB — I-STAT BETA HCG BLOOD, ED (MC, WL, AP ONLY): I-stat hCG, quantitative: <5 m[IU]/mL (ref <5)

## 2020-06-01 LAB — CBC
HCT: 38.4 % (ref 36.0–46.0)
Hemoglobin: 12.6 g/dL (ref 12.0–15.0)
MCH: 28.3 pg (ref 26.0–34.0)
MCHC: 32.8 g/dL (ref 30.0–36.0)
MCV: 86.1 fL (ref 80.0–100.0)
Platelets: 430 10*3/uL — ABNORMAL HIGH (ref 150–400)
RBC: 4.46 MIL/uL (ref 3.87–5.11)
RDW: 14.6 % (ref 11.5–15.5)
WBC: 10.4 10*3/uL (ref 4.0–10.5)
nRBC: 0 % (ref 0.0–0.2)

## 2020-06-01 LAB — BASIC METABOLIC PANEL
Anion gap: 8 (ref 5–15)
BUN: 15 mg/dL (ref 6–20)
CO2: 27 mmol/L (ref 22–32)
Calcium: 8.8 mg/dL — ABNORMAL LOW (ref 8.9–10.3)
Chloride: 100 mmol/L (ref 98–111)
Creatinine, Ser: 1.18 mg/dL — ABNORMAL HIGH (ref 0.44–1.00)
GFR calc Af Amer: >60 mL/min (ref >60)
GFR calc non Af Amer: 60 mL/min — ABNORMAL LOW (ref >60)
Glucose, Bld: 101 mg/dL — ABNORMAL HIGH (ref 70–99)
Potassium: 4.1 mmol/L (ref 3.5–5.1)
Sodium: 135 mmol/L (ref 135–145)

## 2020-06-01 LAB — TROPONIN I (HIGH SENSITIVITY): Troponin I (High Sensitivity): 3 ng/L (ref <18)

## 2020-06-01 MED ORDER — OMEPRAZOLE 20 MG PO CPDR: 20.0000 mg | DELAYED_RELEASE_CAPSULE | Freq: Every day | ORAL | 0 refills | Status: AC

## 2020-06-01 MED ORDER — LIDOCAINE VISCOUS HCL 2 % MT SOLN
15.0000 mL | Freq: Once | OROMUCOSAL | Status: AC
Start: 2020-06-01 — End: 2020-06-01
  Administered 2020-06-01: 15 mL via ORAL
  Filled 2020-06-01: qty 15

## 2020-06-01 MED ORDER — ALUM & MAG HYDROXIDE-SIMETH 200-200-20 MG/5ML PO SUSP
30.0000 mL | Freq: Once | ORAL | Status: AC
  Administered 2020-06-01: 30 mL via ORAL
  Filled 2020-06-01: qty 30

## 2020-06-01 NOTE — ED Triage Notes (Signed)
Patient c/o [facility] intermittent chest pain with SOB x1 month.

## 2020-06-01 NOTE — Discharge Instructions (Addendum)
Creatinine today is 1.18. This is your kidney function. It was slightly elevated. You should drink more water. Have this rechecked by your primary care doctor.  Prescription sent to your pharmacy for Prilosec.  This is used to treat acid reflux.  You should start taking this daily and follow-up with your primary care doctor to further discuss your chest pain.

## 2020-06-01 NOTE — ED Provider Notes (Signed)
Care assumed from Leontine Locket PA-C at shift change pending labs and troponin.  See his note for full H&P.   Briefly this is a 34 year old female presenting with 1 month of intermittent substernal chest pain.  Pain is episodic. Previous provider felt no concerning symptoms for ACS, she has been under increased stress lately.  She is followed by therapist that she talks to weekly.  She also has history of acid reflux not currently on any medications.  PERC negative.  No family history of cardiac disease. EKG without ischemic changes.    Physical Exam  BP (!) 138/98   Pulse 84   Temp 98 F (36.7 C) (Oral)   Resp 18   LMP 04/24/2020 (Approximate)   SpO2 98%   Physical Exam  PE: Constitutional: well-developed, well-nourished, no apparent distress HENT: normocephalic, atraumatic. no cervical adenopathy Cardiovascular: normal rate and rhythm, distal pulses intact Pulmonary/Chest: effort normal; breath sounds clear and equal bilaterally; no wheezes or rales Abdominal: soft and nontender Musculoskeletal: full ROM, no edema Neurological: alert with goal directed thinking Skin: warm and dry, no rash, no diaphoresis Psychiatric: normal mood and affect, normal behavior    ED Course/Procedures     Results for orders placed or performed during the hospital encounter of 06/01/20  Basic metabolic panel  Result Value Ref Range   Sodium 135 135 - 145 mmol/L   Potassium 4.1 3.5 - 5.1 mmol/L   Chloride 100 98 - 111 mmol/L   CO2 27 22 - 32 mmol/L   Glucose, Bld 101 (H) 70 - 99 mg/dL   BUN 15 6 - 20 mg/dL   Creatinine, Ser 2.95 (H) 0.44 - 1.00 mg/dL   Calcium 8.8 (L) 8.9 - 10.3 mg/dL   GFR calc non Af Amer 60 (L) >60 mL/min   GFR calc Af Amer >60 >60 mL/min   Anion gap 8 5 - 15  CBC  Result Value Ref Range   WBC 10.4 4.0 - 10.5 K/uL   RBC 4.46 3.87 - 5.11 MIL/uL   Hemoglobin 12.6 12.0 - 15.0 g/dL   HCT 62.1 36 - 46 %   MCV 86.1 80.0 - 100.0 fL   MCH 28.3 26.0 - 34.0 pg   MCHC 32.8 30.0  - 36.0 g/dL   RDW 30.8 65.7 - 84.6 %   Platelets 430 (H) 150 - 400 K/uL   nRBC 0.0 0.0 - 0.2 %  I-Stat beta hCG blood, ED  Result Value Ref Range   I-stat hCG, quantitative <5.0 <5 mIU/mL   Comment 3          Troponin I (High Sensitivity)  Result Value Ref Range   Troponin I (High Sensitivity) 3 <18 ng/L   DG Chest 2 View  Result Date: 06/01/2020 CLINICAL DATA:  Chest pain. EXAM: CHEST - 2 VIEW COMPARISON:  June 03, 2019 FINDINGS: Enlarged cardiac silhouette.  Mediastinal contours appear intact. There is no evidence of focal airspace consolidation, pleural effusion or pneumothorax. Possible mild pulmonary vascular congestion. Osseous structures are without acute abnormality. Soft tissues are grossly normal. IMPRESSION: 1. Enlarged cardiac silhouette. 2. Possible mild pulmonary vascular congestion. Electronically Signed   By: Ted Mcalpine M.D.   On: 06/01/2020 20:59     MDM  Patient received in sign out. Please see previous provider note to include MDM to this point.  Labs without leukocytosis, no anemia. BMP without significant electrolyte derangement. Creatinine 1.18, slightly up from baseline. Advised patient to increae water intake and have it rechecked by  pcp in 1 week. . Symptoms improved after GI cocktail. Prescription for prilosec given.  The patient appears reasonably screened and/or stabilized for discharge and I doubt any other medical condition or other [facility] requiring further screening, evaluation, or treatment in the ED at this time prior to discharge. The patient is safe for discharge with strict return precautions discussed.    Portions of this note were generated with Scientist, clinical (histocompatibility and immunogenetics). Dictation errors may occur despite best attempts at proofreading.      Kathyrn Lass 06/01/20 [id number]    Bethann Berkshire, MD 06/02/20 [phone]
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
## 2020-08-20 ENCOUNTER — Ambulatory Visit: Payer: Medicaid Other | Admitting: Orthopaedic Surgery

## 2020-09-05 ENCOUNTER — Ambulatory Visit: Payer: Medicaid Other | Admitting: Orthopaedic Surgery

## 2020-10-15 ENCOUNTER — Ambulatory Visit: Payer: Medicaid Other | Admitting: Orthopaedic Surgery

## 2020-10-29 ENCOUNTER — Ambulatory Visit: Payer: Medicaid Other | Admitting: Orthopaedic Surgery

## 2020-11-01 ENCOUNTER — Ambulatory Visit: Payer: Medicaid Other | Admitting: Orthopaedic Surgery

## 2020-11-04 ENCOUNTER — Telehealth: Payer: Self-pay | Admitting: Orthopaedic Surgery

## 2020-11-04 NOTE — Telephone Encounter (Signed)
Please see below and schedule appt with Dr. Roda Shutters.

## 2020-11-04 NOTE — Telephone Encounter (Signed)
Patient called requesting a call back. Patient states she know she missed a lot of appt but would like to be seen if possible. Did not schedule appt until Dr. 

## 2020-11-04 NOTE — Telephone Encounter (Signed)
Patient called requesting a call back. Patient states she know she missed a lot of appt but would like to be seen if possible. Did not schedule appt until Dr.

## 2020-11-04 NOTE — Telephone Encounter (Signed)
Sure that's fine to schedule her but she's not allowed anymore no shows.  Thanks.

## 2020-11-04 NOTE — Telephone Encounter (Signed)
Second part of note to Dr. Roda Shutters. Didn't schedule patient until Dr. Roda Shutters gives apporval

## 2020-11-04 NOTE — Telephone Encounter (Signed)
Pt called to schedule an appt and has no show 4 times and cancelled once. Do you guys want [place] to still schedule her?

## 2020-11-12 ENCOUNTER — Other Ambulatory Visit: Payer: Self-pay

## 2020-11-12 ENCOUNTER — Ambulatory Visit (INDEPENDENT_AMBULATORY_CARE_PROVIDER_SITE_OTHER): Payer: Medicaid Other | Admitting: Orthopaedic Surgery

## 2020-11-12 DIAGNOSIS — M25572 Pain in left ankle and joints of left foot: Secondary | ICD-10-CM | POA: Diagnosis not present

## 2020-11-12 DIAGNOSIS — M19172 Post-traumatic osteoarthritis, left ankle and foot: Secondary | ICD-10-CM | POA: Diagnosis not present
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
## 2020-11-19 ENCOUNTER — Ambulatory Visit: Payer: Medicaid Other | Admitting: Orthopedic Surgery

## 2020-11-25 ENCOUNTER — Ambulatory Visit: Payer: Medicaid Other | Admitting: Orthopedic Surgery

## 2020-12-02 ENCOUNTER — Emergency Department (HOSPITAL_COMMUNITY): Payer: Medicaid Other

## 2020-12-02 ENCOUNTER — Emergency Department (HOSPITAL_COMMUNITY)
Admission: EM | Admit: 2020-12-02 | Discharge: 2020-12-02 | Disposition: A | Discharge status: Home / Self Care | Payer: Medicaid Other | Attending: Emergency Medicine | Admitting: Emergency Medicine

## 2020-12-02 ENCOUNTER — Other Ambulatory Visit: Payer: Self-pay

## 2020-12-02 ENCOUNTER — Encounter (HOSPITAL_COMMUNITY): Payer: Self-pay

## 2020-12-02 DIAGNOSIS — R059 Cough, unspecified: Secondary | ICD-10-CM | POA: Diagnosis present

## 2020-12-02 DIAGNOSIS — J069 Acute upper respiratory infection, unspecified: Secondary | ICD-10-CM | POA: Diagnosis not present

## 2020-12-02 DIAGNOSIS — Z20822 Contact with and (suspected) exposure to covid-19: Secondary | ICD-10-CM | POA: Insufficient documentation

## 2020-12-02 DIAGNOSIS — Z87891 Personal history of nicotine dependence: Secondary | ICD-10-CM | POA: Insufficient documentation

## 2020-12-02 MED ORDER — BENZONATATE 100 MG PO CAPS: 100.0000 mg | ORAL_CAPSULE | Freq: Three times a day (TID) | ORAL | 0 refills | Status: AC

## 2020-12-02 MED ORDER — HYDROCODONE-HOMATROPINE 5-1.5 MG/5ML PO SYRP: 5.0000 mL | ORAL_SOLUTION | Freq: Four times a day (QID) | ORAL | 0 refills | Status: AC | PRN
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
## 2020-12-02 NOTE — ED Triage Notes (Signed)
Pt sts nonproductive cough x 2 days. Pain on left chest/ rib area when coughing.

## 2020-12-03 LAB — SARS CORONAVIRUS 2 (TAT 6-24 HRS): SARS Coronavirus 2: NEGATIVE
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
## 2021-01-01 IMAGING — DX DG ANKLE COMPLETE 3+V*L*
3 series · 3 of 3 positions shown · non-contrast
Comparison: Radiographs July 24, 2015.

CLINICAL DATA: Left ankle pain after injury yesterday.

EXAM:
LEFT ANKLE COMPLETE - 3+ VIEW

[ankle ap]
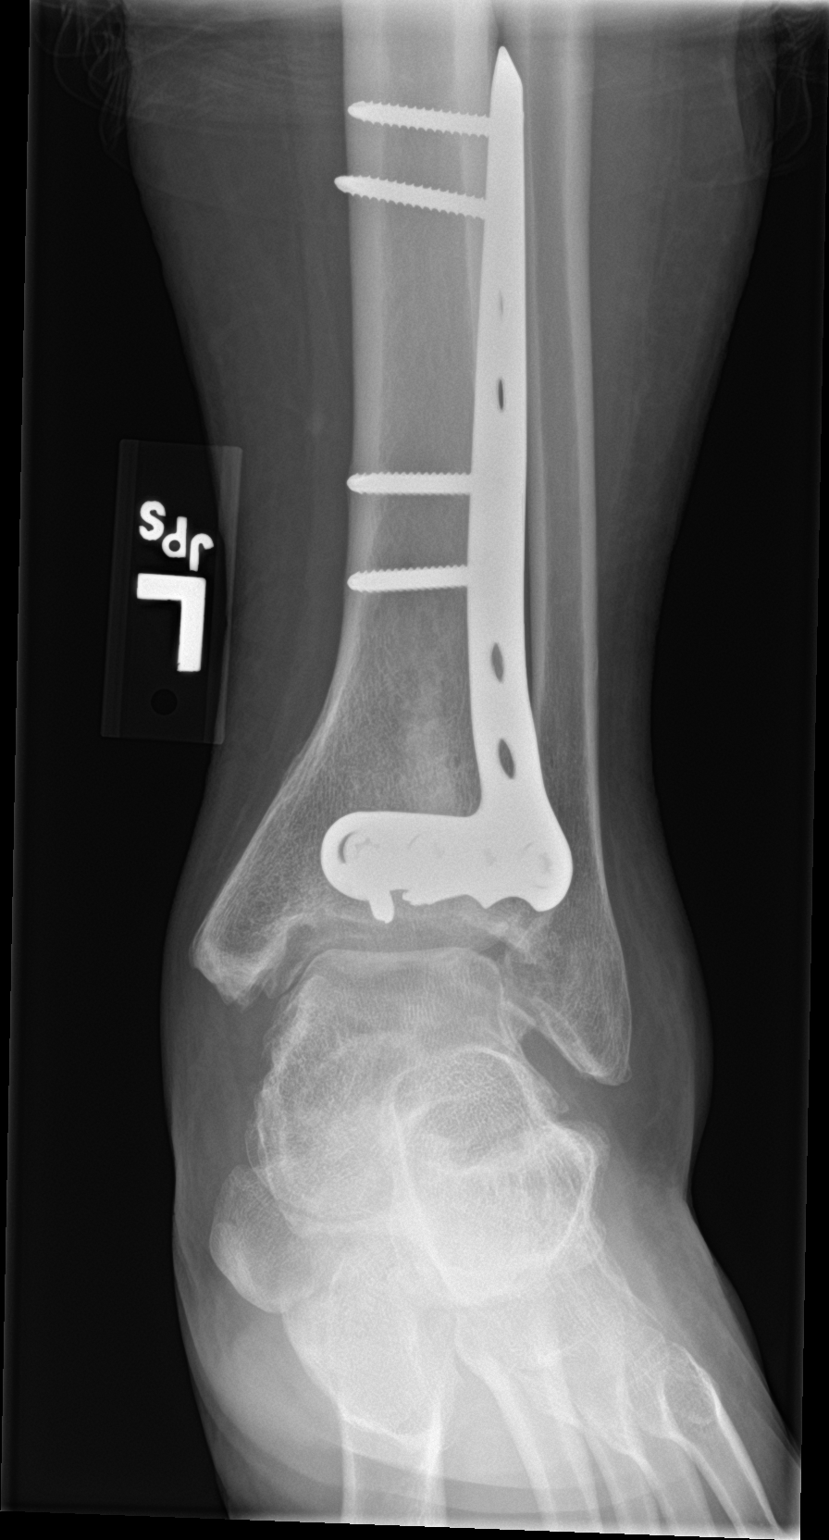

[ankle obl]
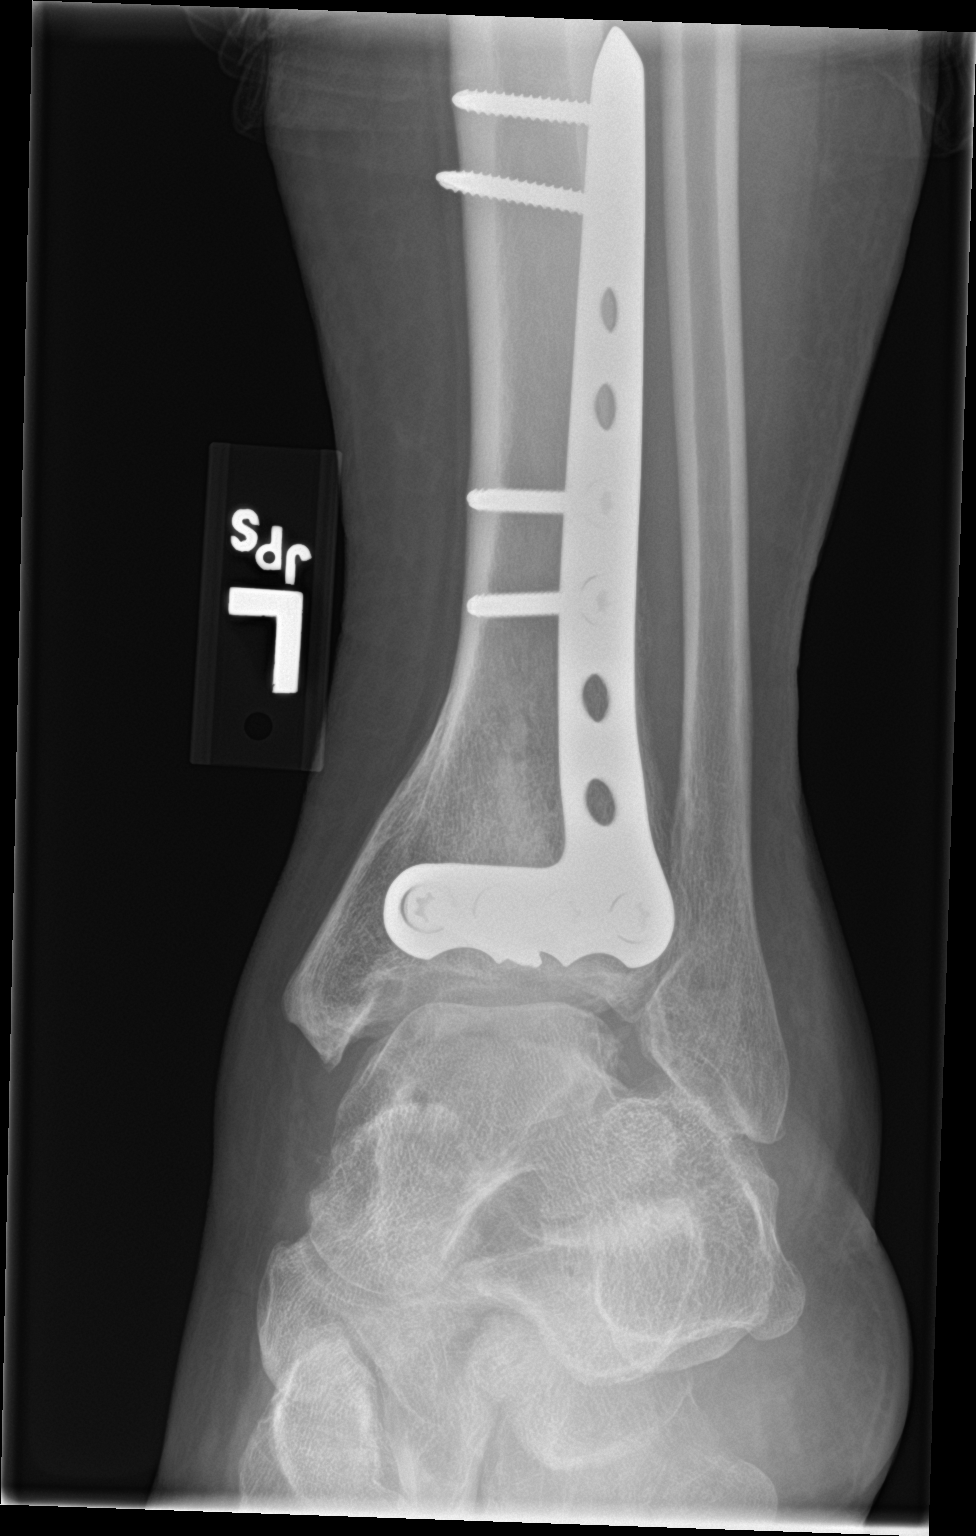

[ankle lat]
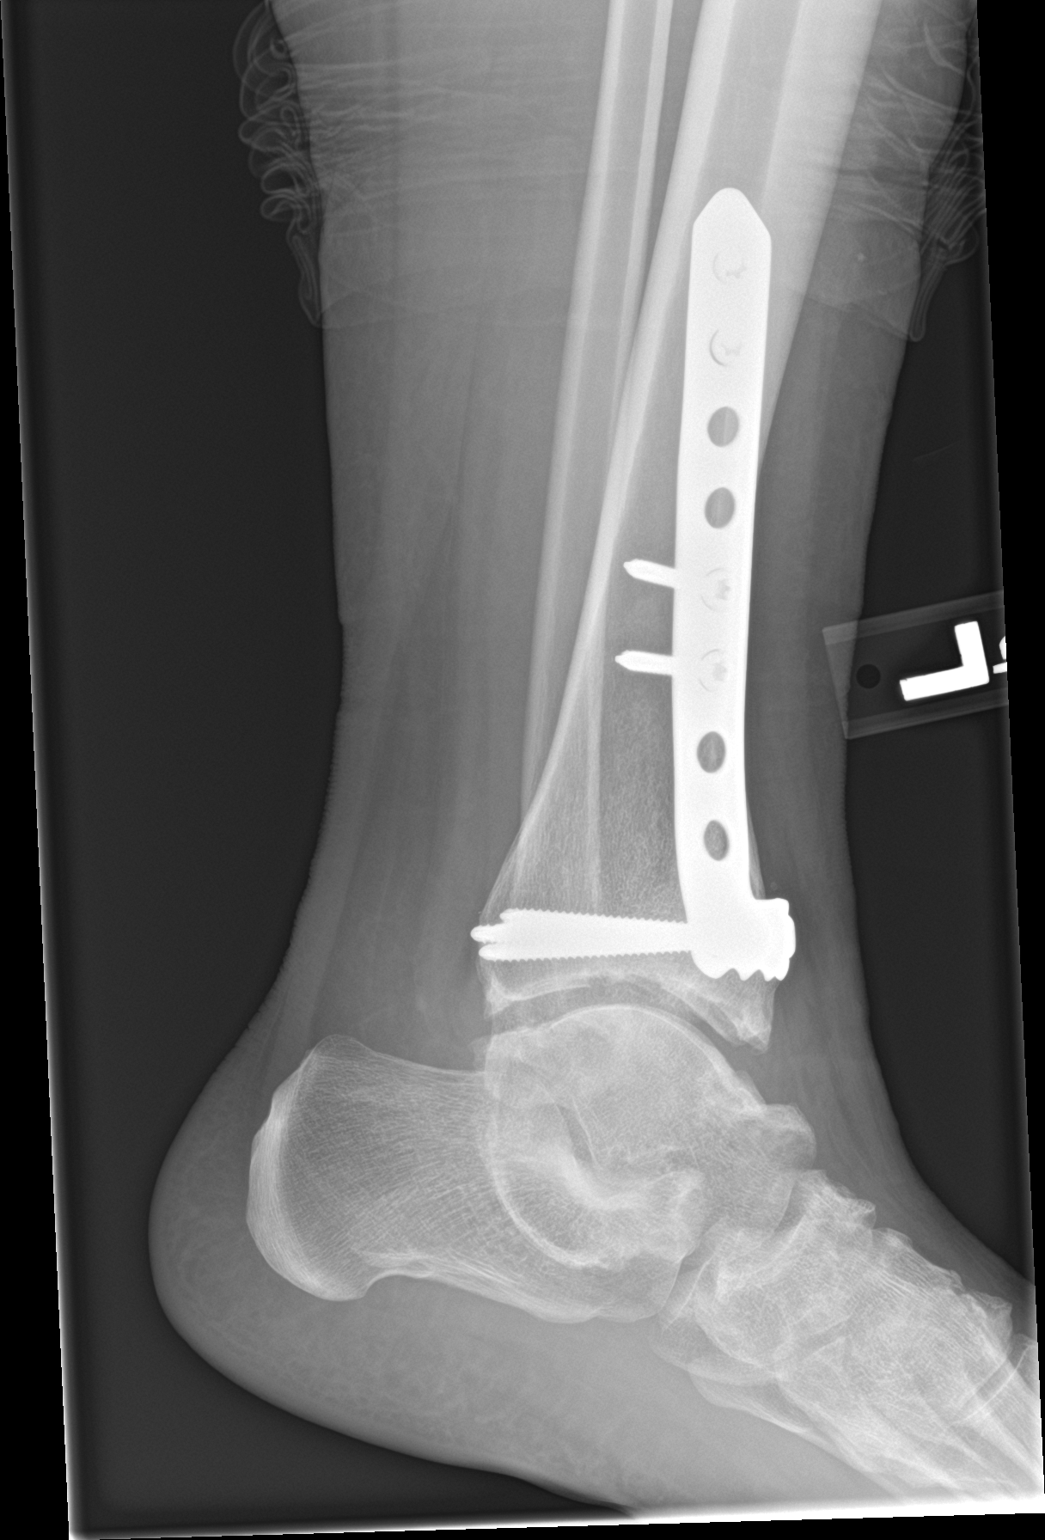

[3 of 3 positions shown; findings below may reference images not displayed]

FINDINGS: Status post surgical internal fixation of old distal left tibial
fracture. No acute fracture or dislocation is noted. Degenerative
changes seen involving the left tibial talar joint. No soft tissue
swelling is noted.
IMPRESSION: Postsurgical and degenerative changes as described above. No acute
abnormality seen in the left ankle.

## 2021-01-24 ENCOUNTER — Ambulatory Visit: Payer: Medicaid Other | Admitting: Orthopaedic Surgery

## 2021-03-18 ENCOUNTER — Emergency Department (HOSPITAL_COMMUNITY): Payer: Medicaid Other

## 2021-03-18 ENCOUNTER — Encounter (HOSPITAL_COMMUNITY): Payer: Self-pay | Admitting: Emergency Medicine

## 2021-03-18 ENCOUNTER — Observation Stay (HOSPITAL_COMMUNITY)
Admission: EM | Admit: 2021-03-18 | Discharge: 2021-03-19 | Disposition: A | Discharge status: Home / Self Care | Payer: Medicaid Other | Attending: Surgery | Admitting: Surgery

## 2021-03-18 DIAGNOSIS — S3991XA Unspecified injury of abdomen, initial encounter: Secondary | ICD-10-CM | POA: Diagnosis not present

## 2021-03-18 DIAGNOSIS — R0789 Other chest pain: Secondary | ICD-10-CM | POA: Diagnosis not present

## 2021-03-18 DIAGNOSIS — Y904 Blood alcohol level of 80-99 mg/100 ml: Secondary | ICD-10-CM | POA: Diagnosis not present

## 2021-03-18 DIAGNOSIS — F10929 Alcohol use, unspecified with intoxication, unspecified: Secondary | ICD-10-CM | POA: Insufficient documentation

## 2021-03-18 DIAGNOSIS — S0990XA Unspecified injury of head, initial encounter: Secondary | ICD-10-CM | POA: Diagnosis present

## 2021-03-18 DIAGNOSIS — S80212A Abrasion, left knee, initial encounter: Secondary | ICD-10-CM | POA: Insufficient documentation

## 2021-03-18 DIAGNOSIS — Z20822 Contact with and (suspected) exposure to covid-19: Secondary | ICD-10-CM | POA: Insufficient documentation

## 2021-03-18 DIAGNOSIS — S0081XA Abrasion of other part of head, initial encounter: Secondary | ICD-10-CM | POA: Diagnosis not present

## 2021-03-18 DIAGNOSIS — T1490XA Injury, unspecified, initial encounter: Secondary | ICD-10-CM

## 2021-03-18 DIAGNOSIS — Y92410 Unspecified street and highway as the place of occurrence of the external cause: Secondary | ICD-10-CM | POA: Diagnosis not present

## 2021-03-18 LAB — PROTIME-INR
INR: 1 (ref 0.8–1.2)
Prothrombin Time: 13.4 seconds (ref 11.4–15.2)

## 2021-03-18 LAB — COMPREHENSIVE METABOLIC PANEL
ALT: 31 U/L (ref 0–44)
AST: 19 U/L (ref 15–41)
Albumin: 2.9 g/dL — ABNORMAL LOW (ref 3.5–5.0)
Alkaline Phosphatase: 54 U/L (ref 38–126)
Anion gap: 9 (ref 5–15)
BUN: 8 mg/dL (ref 6–20)
CO2: 21 mmol/L — ABNORMAL LOW (ref 22–32)
Calcium: 7.9 mg/dL — ABNORMAL LOW (ref 8.9–10.3)
Chloride: 103 mmol/L (ref 98–111)
Creatinine, Ser: 1.25 mg/dL — ABNORMAL HIGH (ref 0.44–1.00)
GFR, Estimated: 57 mL/min — ABNORMAL LOW (ref >60)
Glucose, Bld: 234 mg/dL — ABNORMAL HIGH (ref 70–99)
Potassium: 3.5 mmol/L (ref 3.5–5.1)
Sodium: 133 mmol/L — ABNORMAL LOW (ref 135–145)
Total Bilirubin: 0.7 mg/dL (ref 0.3–1.2)
Total Protein: 5.7 g/dL — ABNORMAL LOW (ref 6.5–8.1)

## 2021-03-18 LAB — I-STAT CHEM 8, ED
BUN: 8 mg/dL (ref 6–20)
Calcium, Ion: 1.02 mmol/L — ABNORMAL LOW (ref 1.15–1.40)
Chloride: 101 mmol/L (ref 98–111)
Creatinine, Ser: 1.4 mg/dL — ABNORMAL HIGH (ref 0.44–1.00)
Glucose, Bld: 233 mg/dL — ABNORMAL HIGH (ref 70–99)
HCT: 39 % (ref 36.0–46.0)
Hemoglobin: 13.3 g/dL (ref 12.0–15.0)
Potassium: 3.6 mmol/L (ref 3.5–5.1)
Sodium: 135 mmol/L (ref 135–145)
TCO2: 22 mmol/L (ref 22–32)

## 2021-03-18 LAB — ETHANOL: Alcohol, Ethyl (B): 90 mg/dL — ABNORMAL HIGH (ref <10)

## 2021-03-18 LAB — CBC
HCT: 38.2 % (ref 36.0–46.0)
Hemoglobin: 12 g/dL (ref 12.0–15.0)
MCH: 27.6 pg (ref 26.0–34.0)
MCHC: 31.4 g/dL (ref 30.0–36.0)
MCV: 87.8 fL (ref 80.0–100.0)
Platelets: 455 10*3/uL — ABNORMAL HIGH (ref 150–400)
RBC: 4.35 MIL/uL (ref 3.87–5.11)
RDW: 14.6 % (ref 11.5–15.5)
WBC: 10.8 10*3/uL — ABNORMAL HIGH (ref 4.0–10.5)
nRBC: 0 % (ref 0.0–0.2)

## 2021-03-18 LAB — RESP PANEL BY RT-PCR (FLU A&B, COVID) ARPGX2
Influenza A by PCR: NEGATIVE
Influenza B by PCR: NEGATIVE
SARS Coronavirus 2 by RT PCR: NEGATIVE

## 2021-03-18 LAB — I-STAT BETA HCG BLOOD, ED (MC, WL, AP ONLY): I-stat hCG, quantitative: <5 m[IU]/mL (ref <5)

## 2021-03-18 LAB — HEMOGLOBIN A1C
Hgb A1c MFr Bld: 7.3 % — ABNORMAL HIGH (ref 4.8–5.6)
Mean Plasma Glucose: 162.81 mg/dL

## 2021-03-18 LAB — SAMPLE TO BLOOD BANK

## 2021-03-18 LAB — LACTIC ACID, PLASMA: Lactic Acid, Venous: 2.7 mmol/L (ref 0.5–1.9)

## 2021-03-18 LAB — GLUCOSE, CAPILLARY: Glucose-Capillary: 182 mg/dL — ABNORMAL HIGH (ref 70–99)

## 2021-03-18 MED ORDER — SODIUM CHLORIDE 0.9 % IV SOLN
INTRAVENOUS | Status: AC | PRN
  Administered 2021-03-18: 1000 mL via INTRAVENOUS

## 2021-03-18 MED ORDER — ONDANSETRON HCL 4 MG/2ML IJ SOLN
4.0000 mg | Freq: Four times a day (QID) | INTRAMUSCULAR | Status: DC | PRN
  Administered 2021-03-18: 4 mg via INTRAVENOUS
  Filled 2021-03-18: qty 2

## 2021-03-18 MED ORDER — ONDANSETRON 4 MG PO TBDP
4.0000 mg | ORAL_TABLET | Freq: Four times a day (QID) | ORAL | Status: DC | PRN
Start: 2021-03-18 — End: 2021-03-19

## 2021-03-18 MED ORDER — SODIUM CHLORIDE 0.9% FLUSH: 3.0000 mL | INTRAVENOUS | Status: DC | PRN

## 2021-03-18 MED ORDER — IOHEXOL 300 MG/ML  SOLN
100.0000 mL | Freq: Once | INTRAMUSCULAR | Status: AC | PRN
  Administered 2021-03-18: 100 mL via INTRAVENOUS

## 2021-03-18 MED ORDER — MORPHINE SULFATE (PF) 4 MG/ML IV SOLN
4.0000 mg | INTRAVENOUS | Status: DC | PRN
  Administered 2021-03-18 – 2021-03-19 (×2): 4 mg via INTRAVENOUS
  Filled 2021-03-18 (×2): qty 1

## 2021-03-18 MED ORDER — SODIUM CHLORIDE 0.9 % IV SOLN: 250.0000 mL | INTRAVENOUS | Status: DC | PRN

## 2021-03-18 MED ORDER — OXYCODONE HCL 5 MG PO TABS
10.0000 mg | ORAL_TABLET | ORAL | Status: DC | PRN
  Administered 2021-03-18 – 2021-03-19 (×3): 10 mg via ORAL
  Filled 2021-03-18 (×3): qty 2

## 2021-03-18 MED ORDER — ENOXAPARIN SODIUM 30 MG/0.3ML IJ SOSY
30.0000 mg | PREFILLED_SYRINGE | Freq: Two times a day (BID) | INTRAMUSCULAR | Status: DC
  Administered 2021-03-19: 30 mg via SUBCUTANEOUS
  Filled 2021-03-18: qty 0.3

## 2021-03-18 MED ORDER — SODIUM CHLORIDE 0.9% FLUSH
3.0000 mL | Freq: Two times a day (BID) | INTRAVENOUS | Status: DC
  Administered 2021-03-18 – 2021-03-19 (×2): 3 mL via INTRAVENOUS

## 2021-03-18 MED ORDER — INSULIN ASPART 100 UNIT/ML IJ SOLN
0.0000 [IU] | Freq: Three times a day (TID) | INTRAMUSCULAR | Status: DC
  Administered 2021-03-19: 2 [IU] via SUBCUTANEOUS

## 2021-03-18 MED ORDER — OXYCODONE HCL 5 MG PO TABS
5.0000 mg | ORAL_TABLET | ORAL | Status: DC | PRN
  Administered 2021-03-19: 5 mg via ORAL
  Filled 2021-03-18: qty 1

## 2021-03-18 MED ORDER — ACETAMINOPHEN 325 MG PO TABS: 650.0000 mg | ORAL_TABLET | ORAL | Status: DC | PRN

## 2021-03-18 NOTE — Progress Notes (Signed)
   03/18/21 1700  Clinical Encounter Type  Visited With Patient not available  Visit Type Trauma  Referral From Nurse  Consult/Referral To Chaplain  The chaplain responded to Trauma 1 page. The patient is being assessed. No family present. No chaplain services needed at this time. The chaplain will follow up as needed.

## 2021-03-18 NOTE — ED Notes (Signed)
Pt returned from CT °
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
## 2021-03-18 NOTE — ED Notes (Signed)
Pt transported to CT w/ Jaquita Folds and Bedelia Person MD
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
## 2021-03-19 ENCOUNTER — Other Ambulatory Visit (HOSPITAL_COMMUNITY): Payer: Self-pay

## 2021-03-19 LAB — HIV ANTIBODY (ROUTINE TESTING W REFLEX): HIV Screen 4th Generation wRfx: NONREACTIVE

## 2021-03-19 LAB — GLUCOSE, CAPILLARY
Glucose-Capillary: 126 mg/dL — ABNORMAL HIGH (ref 70–99)
Glucose-Capillary: 115 mg/dL — ABNORMAL HIGH (ref 70–99)

## 2021-03-19 MED ORDER — OXYCODONE HCL 5 MG PO TABS: 5.0000 mg | ORAL_TABLET | Freq: Four times a day (QID) | ORAL | 0 refills | Status: DC | PRN

## 2021-03-19 MED ORDER — ACETAMINOPHEN 500 MG PO TABS: 1000.0000 mg | ORAL_TABLET | Freq: Four times a day (QID) | ORAL | 0 refills | Status: DC | PRN

## 2021-03-19 MED ORDER — IBUPROFEN 600 MG PO TABS
600.0000 mg | ORAL_TABLET | Freq: Three times a day (TID) | ORAL | Status: DC
  Administered 2021-03-19: 600 mg via ORAL
  Filled 2021-03-19: qty 1

## 2021-03-19 MED ORDER — OXYCODONE HCL 5 MG PO TABS
5.0000 mg | ORAL_TABLET | Freq: Four times a day (QID) | ORAL | 0 refills | Status: DC | PRN
  Filled 2021-03-19: qty 8, 2d supply, fill #0

## 2021-03-19 MED ORDER — ACETAMINOPHEN 500 MG PO TABS
1000.0000 mg | ORAL_TABLET | Freq: Four times a day (QID) | ORAL | Status: DC
  Administered 2021-03-19: 1000 mg via ORAL
  Filled 2021-03-19: qty 2

## 2021-03-19 MED ORDER — IBUPROFEN 600 MG PO TABS: 600.0000 mg | ORAL_TABLET | Freq: Three times a day (TID) | ORAL | 0 refills | Status: AC | PRN

## 2021-03-19 NOTE — Discharge Instructions (Signed)
Resume normal activities as you are able.
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
## 2021-03-19 NOTE — Progress Notes (Signed)
Elson Areas Nijjar to be D/C'd  per MD order.  Discussed with the patient and all questions fully answered.  VSS, Skin clean, dry and intact without evidence of skin break down, no evidence of skin tears noted.  IV catheter discontinued intact. Site without signs and symptoms of complications. Dressing and pressure applied.  An After Visit Summary was printed and given to the patient. Patient received prescriptions from [facility].  D/c education completed with patient/family including follow up instructions, medication list, d/c activities limitations if indicated, with other d/c instructions as indicated by MD - patient able to verbalize understanding, all questions fully answered.   Patient instructed to return to ED, call 911, or call MD for any changes in condition.   Patient to be escorted via WC, and D/C home via [employer] service.

## 2021-03-20 ENCOUNTER — Encounter (HOSPITAL_COMMUNITY): Payer: Self-pay

## 2021-03-27 ENCOUNTER — Ambulatory Visit: Payer: Medicaid Other | Admitting: Orthopaedic Surgery

## 2021-05-13 ENCOUNTER — Telehealth: Payer: Self-pay

## 2021-05-13 NOTE — Telephone Encounter (Signed)
Referral notes received from [facility], Phone #: [phone], Fax #: [phone]   A copy of the referral notes have been placed in the scheduling box for check-out to pick-up and to enter referral. Original notes placed in file cabinet.

## 2021-05-25 ENCOUNTER — Emergency Department (HOSPITAL_BASED_OUTPATIENT_CLINIC_OR_DEPARTMENT_OTHER): Payer: Medicaid Other

## 2021-05-25 ENCOUNTER — Other Ambulatory Visit: Payer: Self-pay

## 2021-05-25 ENCOUNTER — Encounter (HOSPITAL_BASED_OUTPATIENT_CLINIC_OR_DEPARTMENT_OTHER): Payer: Self-pay

## 2021-05-25 ENCOUNTER — Emergency Department (HOSPITAL_BASED_OUTPATIENT_CLINIC_OR_DEPARTMENT_OTHER)
Admission: EM | Admit: 2021-05-25 | Discharge: 2021-05-25 | Disposition: A | Discharge status: Home / Self Care | Payer: Medicaid Other | Attending: Emergency Medicine | Admitting: Emergency Medicine

## 2021-05-25 DIAGNOSIS — M545 Low back pain, unspecified: Secondary | ICD-10-CM | POA: Insufficient documentation

## 2021-05-25 DIAGNOSIS — A599 Trichomoniasis, unspecified: Secondary | ICD-10-CM | POA: Diagnosis not present

## 2021-05-25 DIAGNOSIS — F17210 Nicotine dependence, cigarettes, uncomplicated: Secondary | ICD-10-CM | POA: Diagnosis not present

## 2021-05-25 DIAGNOSIS — J0110 Acute frontal sinusitis, unspecified: Secondary | ICD-10-CM

## 2021-05-25 DIAGNOSIS — R0981 Nasal congestion: Secondary | ICD-10-CM | POA: Diagnosis not present

## 2021-05-25 DIAGNOSIS — F17290 Nicotine dependence, other tobacco product, uncomplicated: Secondary | ICD-10-CM | POA: Insufficient documentation

## 2021-05-25 DIAGNOSIS — M542 Cervicalgia: Secondary | ICD-10-CM | POA: Diagnosis not present

## 2021-05-25 LAB — URINALYSIS, ROUTINE W REFLEX MICROSCOPIC
Bilirubin Urine: NEGATIVE
Glucose, UA: NEGATIVE mg/dL
Ketones, ur: NEGATIVE mg/dL
Leukocytes,Ua: NEGATIVE
Nitrite: NEGATIVE
Protein, ur: NEGATIVE mg/dL
Specific Gravity, Urine: 1.02 (ref 1.005–1.030)
pH: 6 (ref 5.0–8.0)

## 2021-05-25 LAB — URINALYSIS, MICROSCOPIC (REFLEX)

## 2021-05-25 LAB — PREGNANCY, URINE: Preg Test, Ur: NEGATIVE

## 2021-05-25 MED ORDER — CYCLOBENZAPRINE HCL 10 MG PO TABS: 10.0000 mg | ORAL_TABLET | Freq: Three times a day (TID) | ORAL | 0 refills | Status: AC | PRN

## 2021-05-25 MED ORDER — METRONIDAZOLE 500 MG PO TABS: 500.0000 mg | ORAL_TABLET | Freq: Two times a day (BID) | ORAL | 0 refills | Status: DC

## 2021-05-25 MED ORDER — CEFTRIAXONE SODIUM 500 MG IJ SOLR
500.0000 mg | Freq: Once | INTRAMUSCULAR | Status: AC
  Administered 2021-05-25: 500 mg via INTRAMUSCULAR
  Filled 2021-05-25: qty 500

## 2021-05-25 MED ORDER — AZITHROMYCIN 1 G PO PACK
1.0000 g | PACK | Freq: Once | ORAL | Status: AC
  Administered 2021-05-25: 1 g via ORAL
  Filled 2021-05-25: qty 1

## 2021-05-25 MED ORDER — LIDOCAINE HCL (PF) 1 % IJ SOLN
INTRAMUSCULAR | Status: AC
  Filled 2021-05-25: qty 5

## 2021-05-25 NOTE — ED Triage Notes (Signed)
Pt reports generalized body aches throughout most of the body x 2 weeks. Suspects a UTI. Denies any sx but says something's not right.
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
## 2021-05-25 NOTE — ED Notes (Signed)
Patient transported to X-ray 

## 2021-05-25 NOTE — Discharge Instructions (Addendum)
Begin taking Flagyl as prescribed.  Take ibuprofen 600 mg every 6 hours as needed for pain.  Begin taking Flexeril as prescribed this evening as needed for pain not relieved with ibuprofen.  Follow-up with primary doctor if symptoms are not improving in the next week.

## 2021-06-20 ENCOUNTER — Telehealth: Payer: Self-pay

## 2021-06-20 NOTE — Telephone Encounter (Signed)
NOTES SCANNED TO REFERRAL 

## 2021-07-22 ENCOUNTER — Ambulatory Visit: Payer: Medicaid Other | Admitting: Orthopaedic Surgery

## 2021-08-19 ENCOUNTER — Ambulatory Visit: Payer: Medicaid Other | Admitting: Orthopaedic Surgery

## 2021-09-29 ENCOUNTER — Emergency Department (HOSPITAL_BASED_OUTPATIENT_CLINIC_OR_DEPARTMENT_OTHER)
Admission: EM | Admit: 2021-09-29 | Discharge: 2021-09-30 | Disposition: A | Discharge status: Home / Self Care | Payer: Medicaid Other | Attending: Emergency Medicine | Admitting: Emergency Medicine

## 2021-09-29 ENCOUNTER — Emergency Department (HOSPITAL_BASED_OUTPATIENT_CLINIC_OR_DEPARTMENT_OTHER): Payer: Medicaid Other

## 2021-09-29 ENCOUNTER — Encounter (HOSPITAL_BASED_OUTPATIENT_CLINIC_OR_DEPARTMENT_OTHER): Payer: Self-pay | Admitting: Urology

## 2021-09-29 ENCOUNTER — Other Ambulatory Visit: Payer: Self-pay

## 2021-09-29 ENCOUNTER — Ambulatory Visit: Payer: Self-pay | Admitting: *Deleted

## 2021-09-29 DIAGNOSIS — R739 Hyperglycemia, unspecified: Secondary | ICD-10-CM

## 2021-09-29 DIAGNOSIS — Z79899 Other long term (current) drug therapy: Secondary | ICD-10-CM | POA: Diagnosis not present

## 2021-09-29 DIAGNOSIS — M25512 Pain in left shoulder: Secondary | ICD-10-CM | POA: Insufficient documentation

## 2021-09-29 HISTORY — DX: Type 2 diabetes mellitus without complications: E11.9

## 2021-09-29 LAB — CBG MONITORING, ED: Glucose-Capillary: 486 mg/dL — ABNORMAL HIGH (ref 70–99)

## 2021-09-29 MED ORDER — SODIUM CHLORIDE 0.9 % IV BOLUS
1000.0000 mL | Freq: Once | INTRAVENOUS | Status: AC
  Administered 2021-09-30: 1000 mL via INTRAVENOUS

## 2021-09-29 MED ORDER — KETOROLAC TROMETHAMINE 30 MG/ML IJ SOLN
30.0000 mg | Freq: Once | INTRAMUSCULAR | Status: AC
  Administered 2021-09-30: 30 mg via INTRAVENOUS
  Filled 2021-09-29: qty 1

## 2021-09-29 MED ORDER — INSULIN ASPART 100 UNIT/ML IJ SOLN
8.0000 [IU] | Freq: Once | INTRAMUSCULAR | Status: AC
  Administered 2021-09-30: 8 [IU] via SUBCUTANEOUS

## 2021-09-29 NOTE — Telephone Encounter (Signed)
°  Chief Complaint: high blood sugar Symptoms: non Frequency: it was higher yesterday Pertinent Negatives: Patient does not test for ketones Disposition: [] ED /[] Urgent Care (no appt availability in office) / [] Appointment(In office/virtual)/ []  [facility]/ [x] Home Care/ [] Refused Recommended Disposition /[] [facility]/ []  Follow-up with PCP Additional Notes: pt stated during call that she takes glucose after meals. Instructed to take prior to meals, yes to take tonight's insulin. Call back for problems or if still high after insulin. Call came from community line.  Reason for Disposition  [1] Blood glucose 240 - 300 mg/dL ( - mmol/L) AND [2] uses insulin (e.g., insulin-dependent, all people with type 1 diabetes)  Answer Assessment - Initial Assessment Questions 1. BLOOD GLUCOSE: "What is your blood glucose level?"      435 2. ONSET: "When did you check the blood glucose?"     Just now 3. USUAL RANGE: "What is your glucose level usually?" (e.g., usual fasting morning value, usual evening value)     Was over 500 yesterday, high for last month 4. KETONES: "Do you check for ketones (urine or blood test strips)?" If yes, ask: "What does the test show now?"      no 5. TYPE 1 or 2:  "Do you know what type of diabetes you have?"  (e.g., Type 1, Type 2, Gestational; doesn't know)      Type 2 6. INSULIN: "Do you take insulin?" "What type of insulin(s) do you use? What is the mode of delivery? (syringe, pen; injection or pump)?"      Just 10u one time a day 7. DIABETES PILLS: "Do you take any pills for your diabetes?" If yes, ask: "Have you missed taking any pills recently?"     Metformin, has not missed any doses 8. OTHER SYMPTOMS: "Do you have any symptoms?" (e.g., fever, frequent urination, difficulty breathing, dizziness, weakness, vomiting)     On antibiotic for bacterial infection 9. PREGNANCY: "Is there any chance you are pregnant?" "When was your last  menstrual period?"     no  Protocols used: Diabetes - High Blood Sugar-A-AH
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
## 2021-09-29 NOTE — ED Notes (Signed)
EDP at bedside of Pt.   Pt. Reports she is having pain in her L side  Pt. States she can't lift her L arm since she received her flu shot in the L arm. Pt complains that she is having left arm and left shoulder pain and now her head hurts on the left side.
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
## 2021-09-30 LAB — CBC WITH DIFFERENTIAL/PLATELET
Abs Immature Granulocytes: 0.02 10*3/uL (ref 0.00–0.07)
Basophils Absolute: 0.1 10*3/uL (ref 0.0–0.1)
Basophils Relative: 1 %
Eosinophils Absolute: 0.2 10*3/uL (ref 0.0–0.5)
Eosinophils Relative: 2 %
HCT: 38.8 % (ref 36.0–46.0)
Hemoglobin: 12.2 g/dL (ref 12.0–15.0)
Immature Granulocytes: 0 %
Lymphocytes Relative: 26 %
Lymphs Abs: 2.2 10*3/uL (ref 0.7–4.0)
MCH: 27.8 pg (ref 26.0–34.0)
MCHC: 31.4 g/dL (ref 30.0–36.0)
MCV: 88.4 fL (ref 80.0–100.0)
Monocytes Absolute: 0.9 10*3/uL (ref 0.1–1.0)
Monocytes Relative: 10 %
Neutro Abs: 5.2 10*3/uL (ref 1.7–7.7)
Neutrophils Relative %: 61 %
Platelets: 363 10*3/uL (ref 150–400)
RBC: 4.39 MIL/uL (ref 3.87–5.11)
RDW: 14 % (ref 11.5–15.5)
WBC: 8.5 10*3/uL (ref 4.0–10.5)
nRBC: 0 % (ref 0.0–0.2)

## 2021-09-30 LAB — COMPREHENSIVE METABOLIC PANEL
ALT: 22 U/L (ref 0–44)
AST: 21 U/L (ref 15–41)
Albumin: 3.6 g/dL (ref 3.5–5.0)
Alkaline Phosphatase: 78 U/L (ref 38–126)
Anion gap: 13 (ref 5–15)
BUN: 9 mg/dL (ref 6–20)
CO2: 22 mmol/L (ref 22–32)
Calcium: 8.7 mg/dL — ABNORMAL LOW (ref 8.9–10.3)
Chloride: 93 mmol/L — ABNORMAL LOW (ref 98–111)
Creatinine, Ser: 0.92 mg/dL (ref 0.44–1.00)
GFR, Estimated: >60 mL/min (ref >60)
Glucose, Bld: 479 mg/dL — ABNORMAL HIGH (ref 70–99)
Potassium: 3.7 mmol/L (ref 3.5–5.1)
Sodium: 128 mmol/L — ABNORMAL LOW (ref 135–145)
Total Bilirubin: 0.4 mg/dL (ref 0.3–1.2)
Total Protein: 7.2 g/dL (ref 6.5–8.1)

## 2021-09-30 LAB — CBG MONITORING, ED: Glucose-Capillary: 318 mg/dL — ABNORMAL HIGH (ref 70–99)

## 2021-09-30 MED ORDER — NAPROXEN 500 MG PO TABS
500.0000 mg | ORAL_TABLET | Freq: Two times a day (BID) | ORAL | 0 refills | Status: AC
Start: 2021-09-30 — End: ?

## 2021-09-30 MED ORDER — TRAMADOL HCL 50 MG PO TABS: 50.0000 mg | ORAL_TABLET | Freq: Four times a day (QID) | ORAL | 0 refills | Status: DC | PRN

## 2021-09-30 MED ORDER — TRAMADOL HCL 50 MG PO TABS
50.0000 mg | ORAL_TABLET | Freq: Once | ORAL | Status: AC
  Administered 2021-09-30: 01:00:00 50 mg via ORAL
  Filled 2021-09-30: qty 1
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
## 2021-09-30 NOTE — ED Notes (Signed)
Patient discharged to home.  All discharge instructions reviewed.  Patient verbalized understanding via teachback method.  VS WDL.  Respirations even and unlabored.  Ambulatory out of ED.   °

## 2021-09-30 NOTE — Discharge Instructions (Addendum)
Begin taking naproxen as prescribed.  Begin taking tramadol as prescribed as needed for pain not relieved with naproxen.  Wear arm sling for the next several days, then slowly begin to reintroduce activity as tolerated.  Follow-up with your primary doctor if not improving in the next week, and return to the ER if symptoms significantly worsen or change.

## 2022-03-17 ENCOUNTER — Emergency Department (HOSPITAL_COMMUNITY): Payer: Medicaid Other

## 2022-03-17 ENCOUNTER — Other Ambulatory Visit: Payer: Self-pay

## 2022-03-17 ENCOUNTER — Emergency Department (HOSPITAL_COMMUNITY)
Admission: EM | Admit: 2022-03-17 | Discharge: 2022-03-18 | Disposition: A | Discharge status: Home / Self Care | Payer: Medicaid Other | Attending: Emergency Medicine | Admitting: Emergency Medicine

## 2022-03-17 ENCOUNTER — Encounter (HOSPITAL_COMMUNITY): Payer: Self-pay

## 2022-03-17 DIAGNOSIS — R079 Chest pain, unspecified: Secondary | ICD-10-CM | POA: Insufficient documentation

## 2022-03-17 DIAGNOSIS — Z79899 Other long term (current) drug therapy: Secondary | ICD-10-CM | POA: Insufficient documentation

## 2022-03-17 DIAGNOSIS — E119 Type 2 diabetes mellitus without complications: Secondary | ICD-10-CM | POA: Diagnosis not present

## 2022-03-17 LAB — CBC WITH DIFFERENTIAL/PLATELET
Abs Immature Granulocytes: 0.1 10*3/uL — ABNORMAL HIGH (ref 0.00–0.07)
Basophils Absolute: 0.1 10*3/uL (ref 0.0–0.1)
Basophils Relative: 0 %
Eosinophils Absolute: 0.1 10*3/uL (ref 0.0–0.5)
Eosinophils Relative: 1 %
HCT: 37.1 % (ref 36.0–46.0)
Hemoglobin: 11.8 g/dL — ABNORMAL LOW (ref 12.0–15.0)
Immature Granulocytes: 1 %
Lymphocytes Relative: 13 %
Lymphs Abs: 2.3 10*3/uL (ref 0.7–4.0)
MCH: 27.6 pg (ref 26.0–34.0)
MCHC: 31.8 g/dL (ref 30.0–36.0)
MCV: 86.7 fL (ref 80.0–100.0)
Monocytes Absolute: 1.3 10*3/uL — ABNORMAL HIGH (ref 0.1–1.0)
Monocytes Relative: 7 %
Neutro Abs: 14.8 10*3/uL — ABNORMAL HIGH (ref 1.7–7.7)
Neutrophils Relative %: 78 %
Platelets: 444 10*3/uL — ABNORMAL HIGH (ref 150–400)
RBC: 4.28 MIL/uL (ref 3.87–5.11)
RDW: 14 % (ref 11.5–15.5)
WBC: 18.7 10*3/uL — ABNORMAL HIGH (ref 4.0–10.5)
nRBC: 0 % (ref 0.0–0.2)

## 2022-03-17 LAB — BASIC METABOLIC PANEL
Anion gap: 9 (ref 5–15)
BUN: 8 mg/dL (ref 6–20)
CO2: 23 mmol/L (ref 22–32)
Calcium: 9 mg/dL (ref 8.9–10.3)
Chloride: 104 mmol/L (ref 98–111)
Creatinine, Ser: 0.82 mg/dL (ref 0.44–1.00)
GFR, Estimated: >60 mL/min (ref >60)
Glucose, Bld: 109 mg/dL — ABNORMAL HIGH (ref 70–99)
Potassium: 3.3 mmol/L — ABNORMAL LOW (ref 3.5–5.1)
Sodium: 136 mmol/L (ref 135–145)

## 2022-03-17 LAB — RAPID URINE DRUG SCREEN, HOSP PERFORMED
Amphetamines: NOT DETECTED
Barbiturates: NOT DETECTED
Benzodiazepines: NOT DETECTED
Cocaine: POSITIVE — AB
Opiates: NOT DETECTED
Tetrahydrocannabinol: POSITIVE — AB

## 2022-03-17 LAB — I-STAT BETA HCG BLOOD, ED (MC, WL, AP ONLY): I-stat hCG, quantitative: <5 m[IU]/mL (ref <5)

## 2022-03-17 LAB — TROPONIN I (HIGH SENSITIVITY): Troponin I (High Sensitivity): 3 ng/L (ref <18)
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
## 2022-03-17 NOTE — ED Provider Notes (Signed)
[name]-EMERGENCY DEPT Provider Note   CSN: [id number] Arrival date & time: 03/17/22  2152     History  Chief Complaint  Patient presents with   Drug Problem    Nicole Manning is a 36 y.o. female.   Drug Problem    Patient with medical history of bipolar disease, diabetes, schizophrenia, GERD presents today due to chest pain.  Chest pain started after snorting cocaine.  It was sharp, in the middle of her chest with no radiation.  She states she has felt pain like this before by using cocaine.  Denies any shortness of breath or syncopal episodes, no IV drug use.  The pain fully resolved, has not returned.  Also endorses drinking 2 beers prior to arrival.  No SI or HI.  Home Medications Prior to Admission medications   Medication Sig Start Date End Date Taking? Authorizing Provider  acetaminophen (TYLENOL) 500 MG tablet Take 2 tablets (1,000 mg total) by mouth every 6 (six) hours as needed. 03/19/21   Barnetta Chapel, PA-C  benzonatate (TESSALON) 100 MG capsule Take 1 capsule (100 mg total) by mouth every 8 (eight) hours. 12/02/20   Koleen Distance, MD  benztropine (COGENTIN) 1 MG tablet Take 1 tablet (1 mg total) by mouth 2 (two) times daily. 03/22/19   Malvin Johns, MD  cyclobenzaprine (FLEXERIL) 10 MG tablet Take 1 tablet (10 mg total) by mouth 3 (three) times daily as needed for muscle spasms. 05/25/21   Geoffery Lyons, MD  diclofenac sodium (VOLTAREN) 1 % GEL Apply 2 g topically 4 (four) times daily. Patient taking differently: Apply 2 g topically 4 (four) times daily as needed (pain).  11/30/18   Cristie Hem, PA-C  DULoxetine (CYMBALTA) 30 MG capsule Take 60 mg by mouth at bedtime.    [provider]  HYDROcodone-homatropine (HYCODAN) 5-1.5 MG/5ML syrup Take 5 mLs by mouth every 6 (six) hours as needed for cough. 12/02/20   Koleen Distance, MD  ibuprofen (ADVIL) 600 MG tablet Take 1 tablet (600 mg total) by mouth every 8 (eight) hours as needed.  03/19/21   Barnetta Chapel, PA-C  metroNIDAZOLE (FLAGYL) 500 MG tablet Take 1 tablet (500 mg total) by mouth 2 (two) times daily. One po bid x 7 days 05/25/21   Geoffery Lyons, MD  naproxen (NAPROSYN) 500 MG tablet Take 1 tablet (500 mg total) by mouth 2 (two) times daily. 09/30/21   Geoffery Lyons, MD  omeprazole (PRILOSEC) 20 MG capsule Take 1 capsule (20 mg total) by mouth daily. 06/01/20 07/01/20  Walisiewicz, Yvonna Alanis E, PA-C  oxyCODONE (OXY IR/ROXICODONE) 5 MG immediate release tablet Take 1 tablet (5 mg total) by mouth every 6 (six) hours as needed for moderate pain. 03/19/21   Barnetta Chapel, PA-C  risperiDONE (RISPERDAL) 2 MG tablet Take 2 mg by mouth at bedtime.    [provider]  risperiDONE (RISPERDAL) 3 MG tablet Take 2 tablets (6 mg total) by mouth at bedtime. Patient taking differently: Take 3 mg by mouth at bedtime.  03/22/19   Malvin Johns, MD  sertraline (ZOLOFT) 50 MG tablet Take 50 mg by mouth daily. 01/13/19   [provider]  topiramate (TOPAMAX) 25 MG tablet Take 50 mg by mouth 2 (two) times daily.  05/09/19   [provider]  traMADol (ULTRAM) 50 MG tablet Take 1 tablet (50 mg total) by mouth every 6 (six) hours as needed. 09/30/21   Geoffery Lyons, MD  trihexyphenidyl (ARTANE) 2 MG tablet Take  2 mg by mouth at bedtime.    [provider]  albuterol (PROVENTIL HFA;VENTOLIN HFA) 108 (90 Base) MCG/ACT inhaler Inhale 2 puffs into the lungs every 6 (six) hours as needed for wheezing or shortness of breath. Patient not taking: Reported on 09/06/2019 08/26/18 09/25/19  Sudie Grumbling, NP      Allergies    Patient has no known allergies.    Review of Systems   Review of Systems  Physical Exam Updated Vital Signs BP (!) 136/98 (BP Location: Left Arm)   Pulse 87   Temp 97.8 F (36.6 C) (Oral)   Resp 18   Ht 5\' 4"  (1.626 m)   Wt 91 kg   SpO2 98%   BMI 34.44 kg/m  Physical Exam Vitals and nursing note reviewed. Exam conducted with a chaperone  present.  Constitutional:      Appearance: Normal appearance.  HENT:     Head: Normocephalic and atraumatic.  Eyes:     General: No scleral icterus.       Right eye: No discharge.        Left eye: No discharge.     Extraocular Movements: Extraocular movements intact.     Pupils: Pupils are equal, round, and reactive to light.  Cardiovascular:     Rate and Rhythm: Normal rate and regular rhythm.     Pulses: Normal pulses.     Heart sounds: Normal heart sounds. No murmur heard.    No friction rub. No gallop.  Pulmonary:     Effort: Pulmonary effort is normal. No respiratory distress.     Breath sounds: Normal breath sounds.     Comments: Lungs CTA Abdominal:     General: Abdomen is flat. Bowel sounds are normal. There is no distension.     Palpations: Abdomen is soft.     Tenderness: There is no abdominal tenderness.  Skin:    General: Skin is warm and dry.     Coloration: Skin is not jaundiced.  Neurological:     Mental Status: She is alert. Mental status is at baseline.     Coordination: Coordination normal.     ED Results / Procedures / Treatments   Labs (all labs ordered are listed, but only abnormal results are displayed) Labs Reviewed  CBC WITH DIFFERENTIAL/PLATELET - Abnormal; Notable for the following components:      Result Value   WBC 18.7 (*)    Hemoglobin 11.8 (*)    Platelets 444 (*)    Neutro Abs 14.8 (*)    Monocytes Absolute 1.3 (*)    Abs Immature Granulocytes 0.10 (*)    All other components within normal limits  BASIC METABOLIC PANEL - Abnormal; Notable for the following components:   Potassium 3.3 (*)    Glucose, Bld 109 (*)    All other components within normal limits  RAPID URINE DRUG SCREEN, HOSP PERFORMED - Abnormal; Notable for the following components:   Cocaine POSITIVE (*)    Tetrahydrocannabinol POSITIVE (*)    All other components within normal limits  I-STAT BETA HCG BLOOD, ED ([facility], [facility], [facility] ONLY)  TROPONIN I (HIGH SENSITIVITY)     EKG EKG Interpretation  Date/Time:  Tuesday March 17 2022 23:44:35 EDT Ventricular Rate:  85 PR Interval:  153 QRS Duration: 77 QT Interval:  405 QTC Calculation: 482 R Axis:   33 Text Interpretation: Sinus rhythm Probable left atrial enlargement ST elev, probable normal early repol pattern No significant change since last tracing Confirmed by  Ross Marcus ([id number]) on 03/17/2022 11:54:32 PM  Radiology DG Chest 2 View  Result Date: 03/17/2022 CLINICAL DATA:  Chest pain EXAM: CHEST - 2 VIEW COMPARISON:  03/18/2021 FINDINGS: Heart and mediastinal contours are within normal limits. No focal opacities or effusions. No acute bony abnormality. IMPRESSION: No active cardiopulmonary disease. Electronically Signed   By: Charlett Nose M.D.   On: 03/17/2022 23:09    Procedures Procedures    Medications Ordered in ED Medications - No data to display  ED Course/ Medical Decision Making/ A&P Clinical Course as of 03/18/22 0456  Tue Mar 17, 2022  2337 Basic metabolic panel(!) Mild hypokalemia 3.3, no gross electrolyte derangement or AKI [HS]  2338 I-Stat beta hCG blood, ED Patient is not pregnant, not an ectopic [HS]  2338 Troponin I (High Sensitivity) Troponin low at 3 [HS]  2338 CBC with Differential(!) Patient has a mild anemia with hemoglobin 11.8.  Patient has a leukocytosis of 18.7 with left shift.  I think this is likely reactionary from the cocaine usage earlier today. [HS]  2339 DG Chest 2 View I viewed the chest x-ray which does not show any acute process.  Agree with radiologist interpretation. [HS]  Wed Mar 18, 2022  0004 I reevaluated the patient she is resting comfortably.  No recurrence of pain.  Requesting food to eat, given [place] sandwich. [HS]    Clinical Course User Index [HS] Theron Arista, PA-C                           Medical Decision Making Amount and/or Complexity of Data Reviewed Labs: ordered. Decision-making details documented in ED Course. Radiology:  ordered. Decision-making details documented in ED Course.   Patient presents due to chest pain/substance use disorder.  Differential includes but not limited to cocaine induced vasospasm, ACS, pneumonia, PE, pericarditis, pneumonia, pneumothorax, esophageal rupture, AAA, dissection.  On exam vitals are stable, she is not febrile, tachycardic or hypoxic.  Lungs are clear to auscultation bilaterally with regular rhythm.  I ordered and viewed laboratory work-up, imaging and EKG as documented in the ED course.  Patient was on cardiac monitoring here in the ED and was in sinus rhythm per my interpretation.  I ordered, viewed and interpreted laboratory work-up and imaging as documented above in the ED course.  Patient stabilized, I do not think any additional work-up is indicated emergently.  Do not think ACS, doubt PE.  No evidence of pneumonia or underlying structural issues on the chest x-ray.    Patient is stable, chest pain was probably induced from the cocaine usage earlier today.  No evidence of ischemia on work-up and she is been stable while in the ED on cardiac monitoring.  I feel she is appropriate for outpatient follow-up at this time.         Final Clinical Impression(s) / ED Diagnoses Final diagnoses:  Chest pain, unspecified type    Rx / DC Orders ED Discharge Orders     None         Theron Arista, PA-C 03/18/22 0456    Derwood Kaplan, MD 03/24/22 [phone]

## 2022-03-18 NOTE — Discharge Instructions (Signed)
You may take Tylenol Motrin for pain.  Work on abstaining from drug use as this can make your heart race fast and can cause injury.  Return to ED for new or worsening symptoms.

## 2022-03-18 NOTE — ED Notes (Signed)
I provided reinforced discharge education based off of discharge instructions. Pt acknowledged and understood my education. Pt had no further questions/concerns for provider/myself.  °

## 2022-05-07 ENCOUNTER — Ambulatory Visit: Payer: Medicaid Other | Admitting: Orthopaedic Surgery

## 2022-05-08 ENCOUNTER — Ambulatory Visit: Payer: Medicaid Other | Admitting: Orthopaedic Surgery

## 2022-07-01 ENCOUNTER — Encounter (HOSPITAL_COMMUNITY): Payer: Self-pay | Admitting: *Deleted

## 2022-07-01 ENCOUNTER — Other Ambulatory Visit: Payer: Self-pay

## 2022-07-01 ENCOUNTER — Emergency Department (HOSPITAL_COMMUNITY): Payer: Medicaid Other

## 2022-07-01 ENCOUNTER — Emergency Department (HOSPITAL_COMMUNITY)
Admission: EM | Admit: 2022-07-01 | Discharge: 2022-07-01 | Discharge status: Against Medical Advice | Payer: Medicaid Other | Attending: Emergency Medicine | Admitting: Emergency Medicine

## 2022-07-01 DIAGNOSIS — Z5321 Procedure and treatment not carried out due to patient leaving prior to being seen by health care provider: Secondary | ICD-10-CM | POA: Diagnosis not present

## 2022-07-01 DIAGNOSIS — F1490 Cocaine use, unspecified, uncomplicated: Secondary | ICD-10-CM | POA: Insufficient documentation

## 2022-07-01 DIAGNOSIS — R002 Palpitations: Secondary | ICD-10-CM | POA: Diagnosis present

## 2022-07-01 DIAGNOSIS — M542 Cervicalgia: Secondary | ICD-10-CM | POA: Insufficient documentation

## 2022-07-01 DIAGNOSIS — R079 Chest pain, unspecified: Secondary | ICD-10-CM | POA: Insufficient documentation

## 2022-07-01 LAB — BASIC METABOLIC PANEL
Anion gap: 8 (ref 5–15)
BUN: 8 mg/dL (ref 6–20)
CO2: 24 mmol/L (ref 22–32)
Calcium: 8.2 mg/dL — ABNORMAL LOW (ref 8.9–10.3)
Chloride: 98 mmol/L (ref 98–111)
Creatinine, Ser: 0.88 mg/dL (ref 0.44–1.00)
GFR, Estimated: >60 mL/min (ref >60)
Glucose, Bld: 91 mg/dL (ref 70–99)
Potassium: 3.3 mmol/L — ABNORMAL LOW (ref 3.5–5.1)
Sodium: 130 mmol/L — ABNORMAL LOW (ref 135–145)

## 2022-07-01 LAB — CBC
HCT: 37.6 % (ref 36.0–46.0)
Hemoglobin: 11.7 g/dL — ABNORMAL LOW (ref 12.0–15.0)
MCH: 27.3 pg (ref 26.0–34.0)
MCHC: 31.1 g/dL (ref 30.0–36.0)
MCV: 87.9 fL (ref 80.0–100.0)
Platelets: 413 10*3/uL — ABNORMAL HIGH (ref 150–400)
RBC: 4.28 MIL/uL (ref 3.87–5.11)
RDW: 15 % (ref 11.5–15.5)
WBC: 10 10*3/uL (ref 4.0–10.5)
nRBC: 0 % (ref 0.0–0.2)

## 2022-07-01 LAB — TROPONIN I (HIGH SENSITIVITY)
Troponin I (High Sensitivity): 3 ng/L (ref <18)
Troponin I (High Sensitivity): 3 ng/L (ref <18)

## 2022-07-01 MED ORDER — POTASSIUM CHLORIDE CRYS ER 20 MEQ PO TBCR: 40.0000 meq | EXTENDED_RELEASE_TABLET | Freq: Once | ORAL | Status: DC

## 2022-07-01 NOTE — ED Triage Notes (Signed)
Pt arrives from home via [facility]. Per report by medic, the pt is from home, reporting usage of Cocaine 2030 on Tuesday night. She says she is feeling like her heart racing. C/o neck pain. En route, vitals Hr 106, 142/84, sats 99% cbg 100.

## 2022-07-01 NOTE — ED Triage Notes (Signed)
Pt states that she uses cocaine about every other day. Tonight, after snorting cocaine she is having palpitations and central chest pain

## 2022-09-30 DIAGNOSIS — I10 Essential (primary) hypertension: Secondary | ICD-10-CM | POA: Diagnosis not present

## 2022-09-30 DIAGNOSIS — Z23 Encounter for immunization: Secondary | ICD-10-CM | POA: Diagnosis not present

## 2022-09-30 DIAGNOSIS — E782 Mixed hyperlipidemia: Secondary | ICD-10-CM | POA: Diagnosis not present

## 2022-09-30 DIAGNOSIS — Z0000 Encounter for general adult medical examination without abnormal findings: Secondary | ICD-10-CM | POA: Diagnosis not present

## 2022-09-30 DIAGNOSIS — E559 Vitamin D deficiency, unspecified: Secondary | ICD-10-CM | POA: Diagnosis not present

## 2022-09-30 DIAGNOSIS — Z7182 Exercise counseling: Secondary | ICD-10-CM | POA: Diagnosis not present

## 2022-09-30 DIAGNOSIS — E119 Type 2 diabetes mellitus without complications: Secondary | ICD-10-CM | POA: Diagnosis not present

## 2022-09-30 DIAGNOSIS — Z713 Dietary counseling and surveillance: Secondary | ICD-10-CM | POA: Diagnosis not present

## 2022-09-30 DIAGNOSIS — D649 Anemia, unspecified: Secondary | ICD-10-CM | POA: Diagnosis not present

## 2022-10-06 ENCOUNTER — Other Ambulatory Visit: Payer: Self-pay | Admitting: Physician Assistant

## 2022-10-06 DIAGNOSIS — N6312 Unspecified lump in the right breast, upper inner quadrant: Secondary | ICD-10-CM

## 2022-12-09 ENCOUNTER — Other Ambulatory Visit: Payer: Medicaid Other

## 2023-01-22 ENCOUNTER — Other Ambulatory Visit: Payer: Medicaid Other

## 2023-02-08 ENCOUNTER — Other Ambulatory Visit: Payer: Medicaid Other

## 2023-02-25 ENCOUNTER — Other Ambulatory Visit: Payer: Medicaid Other

## 2023-03-25 ENCOUNTER — Ambulatory Visit (HOSPITAL_COMMUNITY)
Admission: EM | Admit: 2023-03-25 | Discharge: 2023-03-25 | Disposition: A | Discharge status: Home / Self Care | Payer: 59

## 2023-03-25 DIAGNOSIS — F1410 Cocaine abuse, uncomplicated: Secondary | ICD-10-CM

## 2023-03-25 DIAGNOSIS — F1290 Cannabis use, unspecified, uncomplicated: Secondary | ICD-10-CM

## 2023-03-25 NOTE — ED Provider Notes (Signed)
Behavioral Health Urgent Care Medical Screening Exam  Patient Name: Nicole Manning MRN: [id number] Date of Evaluation: 03/25/23 Chief Complaint:   Diagnosis:  Final diagnoses:  Cocaine use disorder (HCC)  Occasional use of marijuana    History of Present illness: Nicole Manning is a 37 y.o. female. Patient presents voluntarily to [facility] behavioral health for walk-in assessment.  Patient is accompanied by ACT team representative. She prefers that Nicole Manning with ACT team remain present during assessment.    Patient is assessed, face-to-face, by nurse practitioner. She is seated in assessment area, no acute distress. She is appropriately groomed, consistent eye contact. Patient  presents with depressed mood, congruent affect.   [name] seeking substance use treatment resources today. She relapsed on cocaine approx one month ago after the death of her father. Her sister is briefly residing with her, she uses cocaine regularly triggering Nicole Manning to use.  Patient is insightful regarding triggers.  She states "I am going to have to have a talk with my sister and set a time limit for her visit when I get home."  She endorses cocaine use 3-4 times per week for the last month. Most recent use on yesterday. She uses marijuana to stimulate appetite, recently using marijuana up to 5 times each week. Most recent marijuana use on yesterday.  She endorses rare alcohol use, less than 1 time per week.  Most recent alcohol use on yesterday.  [facility] ACT team currently without substance use counselor.  Nicole Manning diagnoses include bipolar disorder, anxiety, and schizophrenia.  [facility] ACT team manages medications.  She has been stable on a combination of duloxetine and risperidone.  She is compliant with medications.  She endorses history of previous inpatient psychiatric hospitalizations.  Most recent inpatient psychiatric hospitalization in July 2020.  Family mental health history includes  patient's sister diagnosed with addiction disorder.  She denies suicidal and homicidal ideations. Denies history of active suicide attempts, reports "I have been so high before that I thought I could die." Denies history of non suicidal self-harm behavior.  Patient easily  contracts verbally for safety with this Clinical research associate.    Patient has normal speech and behavior.  She  denies auditory and visual hallucinations.  Patient is able to converse coherently with goal-directed thoughts and no distractibility or preoccupation.  Denies symptoms of paranoia.  Objectively there is no evidence of psychosis/mania or delusional thinking.  Nicole Manning resides with 28 year old son in [place]. She denies access to weapons. 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 Patient endorses average sleep and appetite.   Patient offered support and encouragement.    Patient and counselor are educated and verbalize understanding of mental health resources and other crisis services in the community. They are instructed to call 911 and present to the nearest emergency room should patient experience any suicidal/homicidal ideation, auditory/visual/hallucinations, or detrimental worsening of mental health condition.     Flowsheet Row ED from 03/25/2023 in [facility] ED from 07/01/2022 in [facility] Emergency Department at [facility] ED from 03/17/2022 in [facility] Emergency Department at [facility]  C-SSRS RISK CATEGORY No Risk No Risk No Risk       Psychiatric Specialty Exam  Presentation  General Appearance:Appropriate for Environment; Casual  Eye Contact:Good  Speech:Clear and Coherent; Normal Rate  Speech Volume:Normal  Handedness:Right   Mood and Affect  Mood: Depressed  Affect: Depressed   Thought Process  Thought Processes: Coherent; Goal Directed;  Linear  Descriptions of  Associations:Intact  Orientation:Full (Time, Place and Person)  Thought Content:Logical; WDL    Hallucinations:None  Ideas of Reference:None  Suicidal Thoughts:No  Homicidal Thoughts:No   Sensorium  Memory: Immediate Good; Recent Good  Judgment: Fair  Insight: Fair   Executive Functions  Concentration: Good  Attention Span: Good  Recall: Good  Fund of Knowledge: Good  Language: Good   Psychomotor Activity  Psychomotor Activity: Normal   Assets  Assets: Communication Skills; Desire for Improvement; Financial Resources/Insurance; Housing; Physical Health; Resilience; Social Support   Sleep  Sleep: Good  Number of hours: No data recorded  Physical Exam: Physical Exam Vitals and nursing note reviewed.  Constitutional:      Appearance: Normal appearance. She is well-developed.  HENT:     Head: Normocephalic and atraumatic.     Nose: Nose normal.  Cardiovascular:     Rate and Rhythm: Normal rate.  Pulmonary:     Effort: Pulmonary effort is normal.  Musculoskeletal:        General: Normal range of motion.     Cervical back: Normal range of motion.  Skin:    General: Skin is warm and dry.  Neurological:     Mental Status: She is alert and oriented to person, place, and time.  Psychiatric:        Attention and Perception: Attention and perception normal.        Mood and Affect: Affect normal. Mood is depressed.        Speech: Speech normal.        Behavior: Behavior normal. Behavior is cooperative.        Thought Content: Thought content normal.        Cognition and Memory: Cognition and memory normal.    Review of Systems  Constitutional: Negative.   HENT: Negative.    Eyes: Negative.   Respiratory: Negative.    Cardiovascular: Negative.   Gastrointestinal: Negative.   Genitourinary: Negative.   Musculoskeletal: Negative.   Skin: Negative.   Neurological: Negative.   Psychiatric/Behavioral:  Positive for depression and substance  abuse.    Blood pressure (!) 135/94, pulse 91, temperature 98 F (36.7 C), temperature source Oral, resp. rate 18, height 5\' 2"  (1.575 m), weight 189 lb (85.7 kg), SpO2 100%. Body mass index is 34.57 kg/m.  Musculoskeletal: Strength & Muscle Tone: within normal limits Gait & Station: normal Patient leans: N/A   [facility] MSE Discharge Disposition for Follow up and Recommendations: Based on my evaluation the patient does not appear to have an emergency medical condition and can be discharged with resources and follow up care in outpatient services for Medication Management, Individual Therapy, and Group Therapy Follow-up with established outpatient psychiatry through [facility] ACT team.  Continue current medications.  Follow-up with substance use treatment resources provided.  Lenard Lance, FNP 03/25/2023, 7:21 PM

## 2023-03-25 NOTE — Progress Notes (Signed)
   03/25/23 1856  [facility] Triage Screening (Walk-ins at [facility] only)  How Did You Hear About [place]? Self  What Is the Reason for Your Visit/Call Today? Pt presenting to [facility] with her ACTT provider with c/o of cocaine use and is she is seeking detox. Pt reports using cocaine every other day, THC about daily to increase appetite and sporadic alcohol use. Pt reports that her father passed away in 02-28-23 and she is grieving his death. Pt denies SI and HI and reports AH when she is using.  How Long Has This Been Causing You Problems? 1-6 months  Have You Recently Had Any Thoughts About Hurting Yourself? No  Are You Planning to Commit Suicide/Harm Yourself At This time? No  Have you Recently Had Thoughts About Hurting Someone Karolee Ohs? No  Are You Planning To Harm Someone At This Time? No  Are you currently experiencing any auditory, visual or other hallucinations? No  Have You Used Any Alcohol or Drugs in the Past 24 Hours? Yes  How long ago did you use Drugs or Alcohol? yesterday  What Did You Use and How Much? THC and cocaine  Do you have any current medical co-morbidities that require immediate attention? No  Clinician description of patient physical appearance/behavior: tearful  What Do You Feel Would Help You the Most Today? Alcohol or Drug Use Treatment  If access to [facility] was not available, would you have sought care in the Emergency Department? No  Determination of Need Routine (7 days)  Options For Referral Other: Comment (ACTT)

## 2023-03-25 NOTE — Discharge Instructions (Addendum)

## 2023-04-01 ENCOUNTER — Telehealth (HOSPITAL_COMMUNITY): Payer: Self-pay | Admitting: Physician Assistant

## 2023-04-01 ENCOUNTER — Emergency Department (HOSPITAL_COMMUNITY)
Admission: EM | Admit: 2023-04-01 | Discharge: 2023-04-01 | Disposition: A | Discharge status: Home / Self Care | Payer: 59 | Attending: Emergency Medicine | Admitting: Emergency Medicine

## 2023-04-01 ENCOUNTER — Emergency Department (HOSPITAL_COMMUNITY): Payer: 59

## 2023-04-01 ENCOUNTER — Encounter (HOSPITAL_COMMUNITY): Payer: Self-pay | Admitting: Emergency Medicine

## 2023-04-01 ENCOUNTER — Other Ambulatory Visit: Payer: Self-pay

## 2023-04-01 DIAGNOSIS — M542 Cervicalgia: Secondary | ICD-10-CM | POA: Insufficient documentation

## 2023-04-01 DIAGNOSIS — M25512 Pain in left shoulder: Secondary | ICD-10-CM | POA: Diagnosis not present

## 2023-04-01 DIAGNOSIS — R7309 Other abnormal glucose: Secondary | ICD-10-CM | POA: Insufficient documentation

## 2023-04-01 DIAGNOSIS — R519 Headache, unspecified: Secondary | ICD-10-CM | POA: Insufficient documentation

## 2023-04-01 LAB — CBG MONITORING, ED: Glucose-Capillary: 107 mg/dL — ABNORMAL HIGH (ref 70–99)

## 2023-04-01 LAB — HCG, SERUM, QUALITATIVE: Preg, Serum: NEGATIVE

## 2023-04-01 MED ORDER — HYDROCODONE-ACETAMINOPHEN 5-325 MG PO TABS: 1.0000 | ORAL_TABLET | ORAL | 0 refills | Status: DC | PRN

## 2023-04-01 MED ORDER — HYDROCODONE-ACETAMINOPHEN 5-325 MG PO TABS: 2.0000 | ORAL_TABLET | ORAL | 0 refills | Status: AC | PRN

## 2023-04-01 MED ORDER — ACETAMINOPHEN 500 MG PO TABS
1000.0000 mg | ORAL_TABLET | Freq: Once | ORAL | Status: AC
  Administered 2023-04-01: 1000 mg via ORAL
  Filled 2023-04-01: qty 2
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
## 2023-04-01 NOTE — Discharge Instructions (Addendum)
The CT of your head showed a mildly displaced fracture of the left side of your nose.  You are placed on medication to help with pain control, please take this every 4 hours as needed.  Please do not blow your nose.  Schedule an appointment with the ENT specialist for further follow-up.

## 2023-04-09 ENCOUNTER — Telehealth: Payer: Self-pay | Admitting: Otolaryngology
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
## 2023-07-17 ENCOUNTER — Emergency Department (HOSPITAL_COMMUNITY): Payer: 59

## 2023-07-17 ENCOUNTER — Other Ambulatory Visit: Payer: Self-pay

## 2023-07-17 ENCOUNTER — Encounter (HOSPITAL_COMMUNITY): Payer: Self-pay

## 2023-07-17 ENCOUNTER — Emergency Department (HOSPITAL_COMMUNITY)
Admission: EM | Admit: 2023-07-17 | Discharge: 2023-07-17 | Disposition: A | Discharge status: Home / Self Care | Payer: 59 | Attending: Emergency Medicine | Admitting: Emergency Medicine

## 2023-07-17 DIAGNOSIS — M542 Cervicalgia: Secondary | ICD-10-CM | POA: Insufficient documentation

## 2023-07-17 DIAGNOSIS — Y906 Blood alcohol level of 120-199 mg/100 ml: Secondary | ICD-10-CM | POA: Insufficient documentation

## 2023-07-17 DIAGNOSIS — R519 Headache, unspecified: Secondary | ICD-10-CM | POA: Diagnosis not present

## 2023-07-17 DIAGNOSIS — F10220 Alcohol dependence with intoxication, uncomplicated: Secondary | ICD-10-CM | POA: Insufficient documentation

## 2023-07-17 DIAGNOSIS — M25572 Pain in left ankle and joints of left foot: Secondary | ICD-10-CM | POA: Diagnosis not present

## 2023-07-17 DIAGNOSIS — F10920 Alcohol use, unspecified with intoxication, uncomplicated: Secondary | ICD-10-CM

## 2023-07-17 LAB — CBC WITH DIFFERENTIAL/PLATELET
Abs Immature Granulocytes: 0.02 10*3/uL (ref 0.00–0.07)
Basophils Absolute: 0.1 10*3/uL (ref 0.0–0.1)
Basophils Relative: 1 %
Eosinophils Absolute: 0.3 10*3/uL (ref 0.0–0.5)
Eosinophils Relative: 4 %
HCT: 38.5 % (ref 36.0–46.0)
Hemoglobin: 12.3 g/dL (ref 12.0–15.0)
Immature Granulocytes: 0 %
Lymphocytes Relative: 40 %
Lymphs Abs: 3.4 10*3/uL (ref 0.7–4.0)
MCH: 27.4 pg (ref 26.0–34.0)
MCHC: 31.9 g/dL (ref 30.0–36.0)
MCV: 85.7 fL (ref 80.0–100.0)
Monocytes Absolute: 0.7 10*3/uL (ref 0.1–1.0)
Monocytes Relative: 8 %
Neutro Abs: 4.1 10*3/uL (ref 1.7–7.7)
Neutrophils Relative %: 47 %
Platelets: 463 10*3/uL — ABNORMAL HIGH (ref 150–400)
RBC: 4.49 MIL/uL (ref 3.87–5.11)
RDW: 14.6 % (ref 11.5–15.5)
WBC: 8.5 10*3/uL (ref 4.0–10.5)
nRBC: 0 % (ref 0.0–0.2)

## 2023-07-17 LAB — COMPREHENSIVE METABOLIC PANEL
ALT: 48 U/L — ABNORMAL HIGH (ref 0–44)
AST: 24 U/L (ref 15–41)
Albumin: 3.8 g/dL (ref 3.5–5.0)
Alkaline Phosphatase: 58 U/L (ref 38–126)
Anion gap: 13 (ref 5–15)
BUN: 10 mg/dL (ref 6–20)
CO2: 24 mmol/L (ref 22–32)
Calcium: 9.1 mg/dL (ref 8.9–10.3)
Chloride: 95 mmol/L — ABNORMAL LOW (ref 98–111)
Creatinine, Ser: 1.07 mg/dL — ABNORMAL HIGH (ref 0.44–1.00)
GFR, Estimated: >60 mL/min (ref >60)
Glucose, Bld: 125 mg/dL — ABNORMAL HIGH (ref 70–99)
Potassium: 3.8 mmol/L (ref 3.5–5.1)
Sodium: 132 mmol/L — ABNORMAL LOW (ref 135–145)
Total Bilirubin: 0.3 mg/dL (ref <1.2)
Total Protein: 7.8 g/dL (ref 6.5–8.1)

## 2023-07-17 LAB — ETHANOL: Alcohol, Ethyl (B): 169 mg/dL — ABNORMAL HIGH (ref <10)

## 2023-07-17 LAB — HCG, SERUM, QUALITATIVE: Preg, Serum: NEGATIVE

## 2023-07-17 NOTE — Discharge Instructions (Signed)
Reduce your alcohol intake.  Follow-up with your primary doctor.  Return to the ED with new or worsening symptoms. °

## 2023-07-17 NOTE — ED Provider Notes (Signed)
Patient feeling better.  Able to ambulate.  He is clinically sober.  Ate drank without any issues.  Discharged in good condition.  Crutches.  This chart was dictated using voice recognition software.  Despite best efforts to proofread,  errors can occur which can change the documentation meaning.    Virgina Norfolk, DO 07/17/23 1008

## 2023-07-17 NOTE — ED Notes (Signed)
Patient ambulated independently to bathroom to change, as she had urinated on herself and clothes. Paper scrubs given.
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
## 2023-07-17 NOTE — ED Provider Notes (Signed)
[place] EMERGENCY DEPARTMENT AT [facility] Provider Note   CSN: [id number] Arrival date & time: 07/17/23  1914     History  Chief Complaint  Patient presents with   Alcohol Intoxication    Nicole Manning is a 37 y.o. female.  Level 5 caveat for intoxication.  Patient found outside of a bar after having "too much to drink".  [employer] was apparently contacted and then they called EMS.  She was found on the side of the road heavily intoxicated and admits to having at least 10 drinks tonight.  She does not know if she fell or not.  She has pain to her left ankle which she says has been there since 2017 when she had a fracture.  Complains of pain to her head and her neck.  Unable to give a meaningful history.  No chest pain or shortness of breath.  No abdominal pain, nausea, vomiting, cough or fever.  The history is provided by the patient and the EMS personnel. The history is limited by the condition of the patient.  Alcohol Intoxication       Home Medications Prior to Admission medications   Medication Sig Start Date End Date Taking? Authorizing Provider  benzonatate (TESSALON) 100 MG capsule Take 1 capsule (100 mg total) by mouth every 8 (eight) hours. 12/02/20   Koleen Distance, MD  benztropine (COGENTIN) 1 MG tablet Take 1 tablet (1 mg total) by mouth 2 (two) times daily. 03/22/19   Malvin Johns, MD  cyclobenzaprine (FLEXERIL) 10 MG tablet Take 1 tablet (10 mg total) by mouth 3 (three) times daily as needed for muscle spasms. 05/25/21   Geoffery Lyons, MD  diclofenac sodium (VOLTAREN) 1 % GEL Apply 2 g topically 4 (four) times daily. Patient taking differently: Apply 2 g topically 4 (four) times daily as needed (pain).  11/30/18   Cristie Hem, PA-C  DULoxetine (CYMBALTA) 30 MG capsule Take 60 mg by mouth at bedtime.    [provider]  HYDROcodone-acetaminophen (NORCO/VICODIN) 5-325 MG tablet Take 2 tablets by mouth every 4 (four) hours as needed. 04/01/23    Claude Manges, PA-C  HYDROcodone-homatropine (HYCODAN) 5-1.5 MG/5ML syrup Take 5 mLs by mouth every 6 (six) hours as needed for cough. 12/02/20   Koleen Distance, MD  ibuprofen (ADVIL) 600 MG tablet Take 1 tablet (600 mg total) by mouth every 8 (eight) hours as needed. 03/19/21   Barnetta Chapel, PA-C  metroNIDAZOLE (FLAGYL) 500 MG tablet Take 1 tablet (500 mg total) by mouth 2 (two) times daily. One po bid x 7 days 05/25/21   Geoffery Lyons, MD  naproxen (NAPROSYN) 500 MG tablet Take 1 tablet (500 mg total) by mouth 2 (two) times daily. 09/30/21   Geoffery Lyons, MD  omeprazole (PRILOSEC) 20 MG capsule Take 1 capsule (20 mg total) by mouth daily. 06/01/20 07/01/20  Walisiewicz, Yvonna Alanis E, PA-C  risperiDONE (RISPERDAL) 2 MG tablet Take 2 mg by mouth at bedtime.    [provider]  risperiDONE (RISPERDAL) 3 MG tablet Take 2 tablets (6 mg total) by mouth at bedtime. Patient taking differently: Take 3 mg by mouth at bedtime.  03/22/19   Malvin Johns, MD  sertraline (ZOLOFT) 50 MG tablet Take 50 mg by mouth daily. 01/13/19   [provider]  topiramate (TOPAMAX) 25 MG tablet Take 50 mg by mouth 2 (two) times daily.  05/09/19   [provider]  trihexyphenidyl (ARTANE) 2 MG tablet Take 2 mg by mouth  at bedtime.    [provider]  albuterol (PROVENTIL HFA;VENTOLIN HFA) 108 (90 Base) MCG/ACT inhaler Inhale 2 puffs into the lungs every 6 (six) hours as needed for wheezing or shortness of breath. Patient not taking: Reported on 09/06/2019 08/26/18 09/25/19  Sudie Grumbling, NP      Allergies    Patient has no known allergies.    Review of Systems   Review of Systems  Unable to perform ROS: Mental status change    Physical Exam Updated Vital Signs BP 120/71 (BP Location: Left Arm)   Pulse 89   Temp 97.6 F (36.4 C) (Oral)   Resp 18   Ht 5\' 1"  (1.549 m)   Wt 100.7 kg   SpO2 100%   BMI 41.95 kg/m  Physical Exam Vitals and nursing note reviewed.  Constitutional:       General: She is not in acute distress.    Appearance: She is well-developed.     Comments: Intoxicated, oriented to person states her name is "[name]"  HENT:     Head: Normocephalic and atraumatic.     Comments: No obvious wounds to the scalp.    Mouth/Throat:     Pharynx: No oropharyngeal exudate.  Eyes:     Conjunctiva/sclera: Conjunctivae normal.     Pupils: Pupils are equal, round, and reactive to light.  Neck:     Comments: Diffuse paraspinal C-spine tenderness Cardiovascular:     Rate and Rhythm: Normal rate and regular rhythm.     Heart sounds: Normal heart sounds. No murmur heard. Pulmonary:     Effort: Pulmonary effort is normal. No respiratory distress.     Breath sounds: Normal breath sounds.  Abdominal:     Palpations: Abdomen is soft.     Tenderness: There is no abdominal tenderness. There is no guarding or rebound.  Musculoskeletal:        General: Swelling and tenderness present.     Cervical back: Normal range of motion and neck supple.     Comments: Diffuse tenderness to palpation of left ankle without deformity.  Intact distal pulses  Skin:    General: Skin is warm.  Neurological:     Mental Status: She is alert and oriented to person, place, and time.     Cranial Nerves: No cranial nerve deficit.     Motor: No abnormal muscle tone.     Coordination: Coordination normal.     Comments:  5/5 strength throughout. CN 2-12 intact.Equal grip strength.   Psychiatric:        Behavior: Behavior normal.     ED Results / Procedures / Treatments   Labs (all labs ordered are listed, but only abnormal results are displayed) Labs Reviewed  CBC WITH DIFFERENTIAL/PLATELET - Abnormal; Notable for the following components:      Result Value   Platelets 463 (*)    All other components within normal limits  COMPREHENSIVE METABOLIC PANEL - Abnormal; Notable for the following components:   Sodium 132 (*)    Chloride 95 (*)    Glucose, Bld 125 (*)    Creatinine, Ser 1.07  (*)    ALT 48 (*)    All other components within normal limits  ETHANOL - Abnormal; Notable for the following components:   Alcohol, Ethyl (B) 169 (*)    All other components within normal limits  HCG, SERUM, QUALITATIVE    EKG None  Radiology CT Cervical Spine Wo Contrast  Result Date: 07/17/2023 CLINICAL DATA:  37 year old  female found down, intoxicated. Fall, pain. EXAM: CT CERVICAL SPINE WITHOUT CONTRAST TECHNIQUE: Multidetector CT imaging of the cervical spine was performed without intravenous contrast. Multiplanar CT image reconstructions were also generated. RADIATION DOSE REDUCTION: This exam was performed according to the departmental dose-optimization program which includes automated exposure control, adjustment of the mA and/or kV according to patient size and/or use of iterative reconstruction technique. COMPARISON:  Head CT today reported separately. Cervical spine CT 04/01/2023. FINDINGS: Alignment: Chronic straightening but now mild reversal of cervical lordosis. Cervicothoracic junction alignment is within normal limits. Bilateral posterior element alignment is within normal limits. Skull base and vertebrae: Bone mineralization is within normal limits. Visualized skull base is intact. No atlanto-occipital dissociation. C1 and C2 appear intact and aligned. No acute osseous abnormality identified. No osseous abnormality identified in the cervical spine. Bilateral TMJ better demonstrated on the head CT today. Soft tissues and spinal canal: No prevertebral fluid or swelling. No visible canal hematoma. Negative visible noncontrast neck soft tissues. Disc levels:  Negative. Upper chest: Partially visible, negative. IMPRESSION: No acute traumatic injury identified in the cervical spine. Electronically Signed   By: Odessa Fleming M.D.   On: 07/17/2023 05:55   DG Foot Complete Left  Result Date: 07/17/2023 CLINICAL DATA:  37 year old female found down, intoxicated. Fall, pain. EXAM: LEFT FOOT -  COMPLETE 3+ VIEW COMPARISON:  Left ankle series today.  Left foot series 10/24/2018. FINDINGS: Left ankle detailed separately. Chronic pes planus. Chronic dorsal tarsal bone degenerative spurring. Bone mineralization remains normal. Distal joint spaces and alignment are maintained. No acute osseous abnormality identified. Less soft tissue swelling compared to 2020. IMPRESSION: 1. No acute fracture or dislocation identified about the left foot. 2. 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 Electronically Signed   By: Odessa Fleming M.D.   On: 07/17/2023 05:53   DG Ankle Complete Left  Result Date: 07/17/2023 CLINICAL DATA:  37 year old female found down, intoxicated. Fall, pain. EXAM: LEFT ANKLE COMPLETE - 3+ VIEW COMPARISON:  Left ankle series 05/08/2020. FINDINGS: Chronic distal tibia ORIF, tibiotalar degeneration. Distal hardware appears stable and intact. Distal tibia, fibula, talus, and calcaneus appear stable since 2021. Chronic degenerative spurring at the bilateral malleoli. No acute osseous abnormality identified. No evidence of joint effusion on the lateral. IMPRESSION: 1. Chronic posttraumatic and postoperative changes at the left ankle with age advanced arthritis. 2. No acute fracture or dislocation identified. Electronically Signed   By: Odessa Fleming M.D.   On: 07/17/2023 05:52   CT Head Wo Contrast  Result Date: 07/17/2023 CLINICAL DATA:  37 year old female found down, intoxicated. EXAM: CT HEAD WITHOUT CONTRAST TECHNIQUE: Contiguous axial images were obtained from the base of the skull through the vertex without intravenous contrast. RADIATION DOSE REDUCTION: This exam was performed according to the departmental dose-optimization program which includes automated exposure control, adjustment of the mA and/or kV according to patient size and/or use of iterative reconstruction technique. COMPARISON:  Head CT 04/01/2023. FINDINGS: Brain: Chronic dural calcifications  including along the right tentorial incisura. cerebral volume is stable, within normal limits. No midline shift, ventriculomegaly, mass effect, evidence of mass lesion, intracranial hemorrhage or evidence of cortically based acute infarction. Gray-white matter differentiation is within normal limits throughout the brain. Partially empty sella. Vascular: No suspicious intracranial vascular hyperdensity. Skull: Severe chronic left TMJ degeneration. Right TMJ appears to be chronically dislocated or deformed. Calvarium appears stable and intact. No acute osseous abnormality identified. Sinuses/Orbits: Scattered ethmoid sinus mucosal thickening now.  Trace fluid in the sphenoid sinuses. Tympanic cavities and mastoids are clear. Other: Visualized orbits and scalp soft tissues are within normal limits. IMPRESSION: 1. No acute intracranial abnormality or acute traumatic injury identified. 2. Negative noncontrast CT appearance of the brain aside from partially empty sella, often a normal anatomic variant but can be associated with idiopathic intracranial hypertension (pseudotumor cerebri). 3. 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 Electronically Signed   By: Odessa Fleming M.D.   On: 07/17/2023 05:50    Procedures Procedures    Medications Ordered in ED Medications - No data to display  ED Course/ Medical Decision Making/ A&P                                 Medical Decision Making Amount and/or Complexity of Data Reviewed Independent Historian: EMS Labs: ordered. Decision-making details documented in ED Course. Radiology: ordered and independent interpretation performed. Decision-making details documented in ED Course. ECG/medicine tests: ordered and independent interpretation performed. Decision-making details documented in ED Course.   Patient found intoxicated.  Stable vital signs.  Complains of left ankle pain which seems to be chronic Vitals are stable.  She is in no distress.  She  is not forthcoming with history and does not know why she was here.  Admits to drinking about 10 alcoholic drinks tonight.  No obvious trauma.  CT head and C-spine are negative for acute traumatic findings. Chronic changes of left foot and ankle without acute fracture.  Lab remarkable for alcohol intoxication.  Patient will be allowed to sober in the ED.  Care to be transferred at shift change pending improvement in her mental status and metabolization of alcohol.  She will be stable for discharge when she has a sober ride and can ambulate.        Final Clinical Impression(s) / ED Diagnoses Final diagnoses:  None    Rx / DC Orders ED Discharge Orders     None         Shailah Gibbins, Jeannett Senior, MD 07/17/23 [phone]

## 2023-07-22 ENCOUNTER — Other Ambulatory Visit: Payer: Self-pay

## 2023-07-22 ENCOUNTER — Encounter (HOSPITAL_COMMUNITY): Payer: Self-pay | Admitting: Emergency Medicine

## 2023-07-22 ENCOUNTER — Emergency Department (HOSPITAL_COMMUNITY)
Admission: EM | Admit: 2023-07-22 | Discharge: 2023-07-22 | Disposition: A | Discharge status: Home / Self Care | Payer: 59 | Attending: Emergency Medicine | Admitting: Emergency Medicine

## 2023-07-22 DIAGNOSIS — Z113 Encounter for screening for infections with a predominantly sexual mode of transmission: Secondary | ICD-10-CM | POA: Diagnosis present

## 2023-07-22 DIAGNOSIS — Z30012 Encounter for prescription of emergency contraception: Secondary | ICD-10-CM

## 2023-07-22 LAB — PREGNANCY, URINE: Preg Test, Ur: NEGATIVE

## 2023-07-22 LAB — WET PREP, GENITAL
Clue Cells Wet Prep HPF POC: NONE SEEN
Sperm: NONE SEEN
Trich, Wet Prep: NONE SEEN
WBC, Wet Prep HPF POC: <10 (ref <10)
Yeast Wet Prep HPF POC: NONE SEEN

## 2023-07-22 LAB — URINALYSIS, ROUTINE W REFLEX MICROSCOPIC
Bilirubin Urine: NEGATIVE
Glucose, UA: NEGATIVE mg/dL
Hgb urine dipstick: NEGATIVE
Ketones, ur: NEGATIVE mg/dL
Leukocytes,Ua: NEGATIVE
Nitrite: NEGATIVE
Protein, ur: NEGATIVE mg/dL
Specific Gravity, Urine: 1.025 (ref 1.005–1.030)
pH: 6 (ref 5.0–8.0)

## 2023-07-22 MED ORDER — ULIPRISTAL ACETATE 30 MG PO TABS
30.0000 mg | ORAL_TABLET | Freq: Once | ORAL | Status: AC
  Administered 2023-07-22: 30 mg via ORAL
  Filled 2023-07-22: qty 1

## 2023-07-22 MED ORDER — ULIPRISTAL ACETATE 30 MG PO TABS: 30.0000 mg | ORAL_TABLET | Freq: Once | ORAL | Status: DC

## 2023-07-22 MED ORDER — VALACYCLOVIR HCL 500 MG PO TABS: 500.0000 mg | ORAL_TABLET | Freq: Two times a day (BID) | ORAL | 2 refills | Status: AC

## 2023-07-22 NOTE — Discharge Instructions (Addendum)
Your pregnancy test was negative, and you were provided emergency contraception.  You may experience some nausea or irregular bleeding with that.  The sore that you had did not appear consistent with herpes however I am still provide treatment given you have had this in the past. Valtrex was sent to your pharmacy. Your STD testing should return in 2 to 3 days.

## 2023-07-22 NOTE — ED Provider Notes (Signed)
[place] EMERGENCY DEPARTMENT AT [facility] Provider Note   CSN: [id number] Arrival date & time: 07/22/23  1819     History  Chief Complaint  Patient presents with   SEXUALLY TRANSMITTED DISEASE    Nicole Manning is a 37 y.o. female.  37 year old female presenting with concerns of bump in vaginal area.  She states she had unprotected sexual intercourse 5 days ago.  She began to have a strong foul odor in her vaginal area 2 days ago and recently noticed a bump in her vaginal area.  She states she had herpes in her early 49s but has never had an outbreak since taking a medication at that time.  She is unable to name the medication.  She denies any vaginal discharge, dysuria.  She is not currently on any form of birth control.  LMP 07/09/2023.  She is requesting complete STD testing.      Home Medications Prior to Admission medications   Medication Sig Start Date End Date Taking? Authorizing Provider  valACYclovir (VALTREX) 500 MG tablet Take 1 tablet (500 mg total) by mouth 2 (two) times daily for 3 days. 07/22/23 07/25/23 Yes Shelby Mattocks, DO  benzonatate (TESSALON) 100 MG capsule Take 1 capsule (100 mg total) by mouth every 8 (eight) hours. 12/02/20   Koleen Distance, MD  benztropine (COGENTIN) 1 MG tablet Take 1 tablet (1 mg total) by mouth 2 (two) times daily. 03/22/19   Malvin Johns, MD  cyclobenzaprine (FLEXERIL) 10 MG tablet Take 1 tablet (10 mg total) by mouth 3 (three) times daily as needed for muscle spasms. 05/25/21   Geoffery Lyons, MD  diclofenac sodium (VOLTAREN) 1 % GEL Apply 2 g topically 4 (four) times daily. Patient taking differently: Apply 2 g topically 4 (four) times daily as needed (pain).  11/30/18   Cristie Hem, PA-C  DULoxetine (CYMBALTA) 30 MG capsule Take 60 mg by mouth at bedtime.    [provider]  HYDROcodone-acetaminophen (NORCO/VICODIN) 5-325 MG tablet Take 2 tablets by mouth every 4 (four) hours as needed. 04/01/23   Claude Manges, PA-C  HYDROcodone-homatropine (HYCODAN) 5-1.5 MG/5ML syrup Take 5 mLs by mouth every 6 (six) hours as needed for cough. 12/02/20   Koleen Distance, MD  ibuprofen (ADVIL) 600 MG tablet Take 1 tablet (600 mg total) by mouth every 8 (eight) hours as needed. 03/19/21   Barnetta Chapel, PA-C  metroNIDAZOLE (FLAGYL) 500 MG tablet Take 1 tablet (500 mg total) by mouth 2 (two) times daily. One po bid x 7 days 05/25/21   Geoffery Lyons, MD  naproxen (NAPROSYN) 500 MG tablet Take 1 tablet (500 mg total) by mouth 2 (two) times daily. 09/30/21   Geoffery Lyons, MD  omeprazole (PRILOSEC) 20 MG capsule Take 1 capsule (20 mg total) by mouth daily. 06/01/20 07/01/20  Walisiewicz, Yvonna Alanis E, PA-C  risperiDONE (RISPERDAL) 2 MG tablet Take 2 mg by mouth at bedtime.    [provider]  risperiDONE (RISPERDAL) 3 MG tablet Take 2 tablets (6 mg total) by mouth at bedtime. Patient taking differently: Take 3 mg by mouth at bedtime.  03/22/19   Malvin Johns, MD  sertraline (ZOLOFT) 50 MG tablet Take 50 mg by mouth daily. 01/13/19   [provider]  topiramate (TOPAMAX) 25 MG tablet Take 50 mg by mouth 2 (two) times daily.  05/09/19   [provider]  trihexyphenidyl (ARTANE) 2 MG tablet Take 2 mg by mouth at bedtime.    [provider]  albuterol (PROVENTIL HFA;VENTOLIN HFA) 108 (90 Base) MCG/ACT inhaler Inhale 2 puffs into the lungs every 6 (six) hours as needed for wheezing or shortness of breath. Patient not taking: Reported on 09/06/2019 08/26/18 09/25/19  Sudie Grumbling, NP      Allergies    Patient has no known allergies.    Review of Systems   Review of Systems  Genitourinary:  Positive for genital sores. Negative for dysuria.   Physical Exam Updated Vital Signs BP (!) 145/95 (BP Location: Left Arm)   Pulse 91   Temp 98.2 F (36.8 C) (Oral)   Resp 16   Ht 5\' 1"  (1.549 m)   Wt 100.7 kg   LMP 07/09/2023   SpO2 100%   BMI 41.95 kg/m  Pelvic exam: VULVA: erythematous  non-umbilicated 1mm singular lesion on the right inferior portion of the vulva, VAGINA: vaginal discharge - white, CERVIX: normal appearing cervix without discharge or lesions, UTERUS: uterus is normal size, shape, consistency and nontender, ADNEXA: normal adnexa in size, nontender and no masses.   Chaperone present during exam.  ED Results / Procedures / Treatments   Labs (all labs ordered are listed, but only abnormal results are displayed) Labs Reviewed  WET PREP, GENITAL  URINALYSIS, ROUTINE W REFLEX MICROSCOPIC  PREGNANCY, URINE  RPR  HIV ANTIBODY (ROUTINE TESTING W REFLEX)  HEPATITIS B SURFACE ANTIGEN  HCV AB W REFLEX TO QUANT PCR  GC/CHLAMYDIA PROBE AMP ([place]) NOT AT [facility]    EKG None  Radiology No results found.  Procedures Procedures    Medications Ordered in ED Medications  ulipristal acetate (ELLA) tablet 30 mg (30 mg Oral Given 07/22/23 2005)    ED Course/ Medical Decision Making/ A&P                                 Medical Decision Making 37 year old female presents 5 days after unprotected sexual intercourse with concern of herpes.  Evaluation revealed a small singular sore on the right vulvar region that does not clinically appear as herpes.  However, given her history of this in the past, will treat with Valtrex x 3 days.  Additionally, screen for STDs.  UA and wet prep unremarkable.  Urine pregnancy negative and given she is within the 120-hour window, provided emergency contraception in the form of ulipristal.  Stable for discharge.  Amount and/or Complexity of Data Reviewed Labs: ordered.         Final Clinical Impression(s) / ED Diagnoses Final diagnoses:  Encounter for screening examination for sexually transmitted disease  Encounter for emergency contraception    Rx / DC Orders ED Discharge Orders          Ordered    valACYclovir (VALTREX) 500 MG tablet  2 times daily        07/22/23 2006              Shelby Mattocks,  DO 07/22/23 2016    Long, Arlyss Repress, MD 07/29/23 1510

## 2023-07-22 NOTE — ED Triage Notes (Signed)
Patient arrives ambulatory by POV c/o bump and pain to vaginal area. States she is concerned she has herpes. Reports foul odor to urine x 2 days.

## 2023-07-23 LAB — RPR: RPR Ser Ql: NONREACTIVE

## 2023-07-23 LAB — HIV ANTIBODY (ROUTINE TESTING W REFLEX): HIV Screen 4th Generation wRfx: NONREACTIVE

## 2023-07-23 LAB — GC/CHLAMYDIA PROBE AMP (~~LOC~~) NOT AT ARMC
Chlamydia: NEGATIVE
Comment: NEGATIVE
Comment: NORMAL
Neisseria Gonorrhea: NEGATIVE

## 2023-07-23 LAB — HEPATITIS B SURFACE ANTIGEN: Hepatitis B Surface Ag: NONREACTIVE

## 2023-07-24 LAB — HCV INTERPRETATION

## 2023-07-24 LAB — HCV AB W REFLEX TO QUANT PCR: HCV Ab: NONREACTIVE

## 2023-08-04 LAB — MISC LABCORP TEST (SEND OUT): Labcorp test code: 6510

## 2023-08-27 ENCOUNTER — Other Ambulatory Visit: Payer: Self-pay | Admitting: Family Medicine

## 2023-08-27 DIAGNOSIS — N6320 Unspecified lump in the left breast, unspecified quadrant: Secondary | ICD-10-CM

## 2023-08-29 ENCOUNTER — Emergency Department (HOSPITAL_COMMUNITY)
Admission: EM | Admit: 2023-08-29 | Discharge: 2023-08-29 | Disposition: A | Discharge status: Home / Self Care | Payer: 59 | Attending: Emergency Medicine | Admitting: Emergency Medicine

## 2023-08-29 ENCOUNTER — Encounter (HOSPITAL_COMMUNITY): Payer: Self-pay

## 2023-08-29 ENCOUNTER — Other Ambulatory Visit: Payer: Self-pay

## 2023-08-29 ENCOUNTER — Emergency Department (HOSPITAL_COMMUNITY): Payer: 59

## 2023-08-29 DIAGNOSIS — W19XXXA Unspecified fall, initial encounter: Secondary | ICD-10-CM | POA: Insufficient documentation

## 2023-08-29 DIAGNOSIS — Y9302 Activity, running: Secondary | ICD-10-CM | POA: Diagnosis not present

## 2023-08-29 DIAGNOSIS — S93402A Sprain of unspecified ligament of left ankle, initial encounter: Secondary | ICD-10-CM | POA: Diagnosis not present

## 2023-08-29 DIAGNOSIS — M25572 Pain in left ankle and joints of left foot: Secondary | ICD-10-CM | POA: Diagnosis present

## 2023-08-29 MED ORDER — IBUPROFEN 800 MG PO TABS
800.0000 mg | ORAL_TABLET | Freq: Once | ORAL | Status: AC
  Administered 2023-08-29: 800 mg via ORAL
  Filled 2023-08-29: qty 1

## 2023-08-29 NOTE — ED Provider Notes (Signed)
[place] EMERGENCY DEPARTMENT AT [facility] Provider Note   CSN: [id number] Arrival date & time: 08/29/23  1236     History  Chief Complaint  Patient presents with   Ankle Pain    Nicole Manning is a 37 y.o. female.  Patient with history of schizophrenia presents today with [employer] with complaints of left ankle pain. States that she was running from the police today when she fell and hurt her left ankle. She did not hit her head or lose consciousness.  She is not anticoagulated.  She has been able to walk since with pain in her ankle.  Does note that she had rods and screws placed in her ankle from a previous fracture and is concerned about damage to the hardware in her ankle. Denies any other injuries or complaints.  The history is provided by the patient. No language interpreter was used.  Ankle Pain      Home Medications Prior to Admission medications   Medication Sig Start Date End Date Taking? Authorizing Provider  benzonatate (TESSALON) 100 MG capsule Take 1 capsule (100 mg total) by mouth every 8 (eight) hours. 12/02/20   Koleen Distance, MD  benztropine (COGENTIN) 1 MG tablet Take 1 tablet (1 mg total) by mouth 2 (two) times daily. 03/22/19   Malvin Johns, MD  cyclobenzaprine (FLEXERIL) 10 MG tablet Take 1 tablet (10 mg total) by mouth 3 (three) times daily as needed for muscle spasms. 05/25/21   Geoffery Lyons, MD  diclofenac sodium (VOLTAREN) 1 % GEL Apply 2 g topically 4 (four) times daily. Patient taking differently: Apply 2 g topically 4 (four) times daily as needed (pain).  11/30/18   Cristie Hem, PA-C  DULoxetine (CYMBALTA) 30 MG capsule Take 60 mg by mouth at bedtime.    [provider]  HYDROcodone-acetaminophen (NORCO/VICODIN) 5-325 MG tablet Take 2 tablets by mouth every 4 (four) hours as needed. 04/01/23   Claude Manges, PA-C  HYDROcodone-homatropine (HYCODAN) 5-1.5 MG/5ML syrup Take 5 mLs by mouth every 6 (six) hours as needed for cough.  12/02/20   Koleen Distance, MD  ibuprofen (ADVIL) 600 MG tablet Take 1 tablet (600 mg total) by mouth every 8 (eight) hours as needed. 03/19/21   Barnetta Chapel, PA-C  metroNIDAZOLE (FLAGYL) 500 MG tablet Take 1 tablet (500 mg total) by mouth 2 (two) times daily. One po bid x 7 days 05/25/21   Geoffery Lyons, MD  naproxen (NAPROSYN) 500 MG tablet Take 1 tablet (500 mg total) by mouth 2 (two) times daily. 09/30/21   Geoffery Lyons, MD  omeprazole (PRILOSEC) 20 MG capsule Take 1 capsule (20 mg total) by mouth daily. 06/01/20 07/01/20  Walisiewicz, Yvonna Alanis E, PA-C  risperiDONE (RISPERDAL) 2 MG tablet Take 2 mg by mouth at bedtime.    [provider]  risperiDONE (RISPERDAL) 3 MG tablet Take 2 tablets (6 mg total) by mouth at bedtime. Patient taking differently: Take 3 mg by mouth at bedtime.  03/22/19   Malvin Johns, MD  sertraline (ZOLOFT) 50 MG tablet Take 50 mg by mouth daily. 01/13/19   [provider]  topiramate (TOPAMAX) 25 MG tablet Take 50 mg by mouth 2 (two) times daily.  05/09/19   [provider]  trihexyphenidyl (ARTANE) 2 MG tablet Take 2 mg by mouth at bedtime.    [provider]  albuterol (PROVENTIL HFA;VENTOLIN HFA) 108 (90 Base) MCG/ACT inhaler Inhale 2 puffs into the lungs every 6 (six) hours as needed for  wheezing or shortness of breath. Patient not taking: Reported on 09/06/2019 08/26/18 09/25/19  Sudie Grumbling, NP      Allergies    Patient has no known allergies.    Review of Systems   Review of Systems  Musculoskeletal:  Positive for arthralgias.  All other systems reviewed and are negative.   Physical Exam Updated Vital Signs BP (!) 154/76   Pulse 99   Temp 98.3 F (36.8 C) (Oral)   Resp 18   Ht 5\' 1"  (1.549 m)   Wt 100.7 kg   SpO2 100%   BMI 41.95 kg/m  Physical Exam Vitals and nursing note reviewed.  Constitutional:      General: She is not in acute distress.    Appearance: Normal appearance. She is normal weight. She is not  ill-appearing, toxic-appearing or diaphoretic.  HENT:     Head: Normocephalic and atraumatic.  Cardiovascular:     Rate and Rhythm: Normal rate.  Pulmonary:     Effort: Pulmonary effort is normal. No respiratory distress.  Musculoskeletal:        General: Normal range of motion.     Cervical back: Normal range of motion.     Comments: TTP left medial ankle without obvious swelling, bruising, or deformity. Compartments soft, DP and PT pulses intact and 2+. Good capillary refill. Ambulatory.  Skin:    General: Skin is warm and dry.  Neurological:     General: No focal deficit present.     Mental Status: She is alert.  Psychiatric:        Mood and Affect: Mood normal.        Behavior: Behavior normal.     ED Results / Procedures / Treatments   Labs (all labs ordered are listed, but only abnormal results are displayed) Labs Reviewed - No data to display  EKG None  Radiology DG Ankle Complete Left Result Date: 08/29/2023 CLINICAL DATA:  Ankle pain EXAM: LEFT ANKLE COMPLETE - 3+ VIEW COMPARISON:  Left ankle radiographs dated July 17, 2023 FINDINGS: Prior ORIF of the distal tibia with intact hardware. No acute fracture or dislocation. No significant joint effusion identified. Similar hypertrophic osseous changes at the tips of the medial and lateral malleoli, likely relates to prior trauma. Degenerative changes of the tibiotalar articulation. Mild soft tissue swelling of the ankle, most pronounced laterally. IMPRESSION: 1. No acute osseous abnormality. 2. Similar postoperative/posttraumatic changes of the left ankle with degenerative changes of the tibiotalar joint. Electronically Signed   By: Hart Robinsons M.D.   On: 08/29/2023 13:48    Procedures Procedures    Medications Ordered in ED Medications  ibuprofen (ADVIL) tablet 800 mg (has no administration in time range)    ED Course/ Medical Decision Making/ A&P                                 Medical Decision  Making Amount and/or Complexity of Data Reviewed Radiology: ordered.  Risk Prescription drug management.   Patient presents today with complaints of left ankle pain from fall immediately prior to arrival.  She is afebrile, nontoxic-appearing, and in no acute distress with reassuring vital signs.  Physical exam reveals TTP to the medial aspect of the left ankle without bruising, swelling, or deformity.  DP and PT pulses intact and 2+.  Compartments soft.  X-ray imaging obtained which has resulted and reveals   1. No acute osseous abnormality. 2. Similar  postoperative/posttraumatic changes of the left ankle with degenerative changes of the tibiotalar joint.   I personally reviewed and interpreted this imaging and agree with radiology interpretation.  Patient likely with ankle sprain, given ASO ankle brace for support. Pain improved with ibuprofen. Will give referral to ortho for follow-up. RICE and analgesia with tylenol/ibuprofen recommended and discussed. Evaluation and diagnostic testing in the emergency department does not suggest an emergent condition requiring admission or immediate intervention beyond what has been performed at this time.  Plan for discharge with close PCP follow-up.  Patient is understanding and amenable with plan, educated on red flag symptoms that would prompt immediate return.  Patient discharged in stable condition.   Final Clinical Impression(s) / ED Diagnoses Final diagnoses:  Sprain of left ankle, unspecified ligament, initial encounter    Rx / DC Orders ED Discharge Orders     None     An After Visit Summary was printed and given to the patient.     Vear Clock 08/29/23 1422    Pricilla Loveless, MD 08/30/23 [phone]

## 2023-08-29 NOTE — Progress Notes (Signed)
Orthopedic Tech Progress Note Patient Details:  Nicole Manning 18-Oct-1985 [id number]  Ortho Devices Type of Ortho Device: Ankle splint Ortho Device/Splint Location: LLE Ortho Device/Splint Interventions: Ordered, Application, Adjustment   Post Interventions Patient Tolerated: Well Instructions Provided: Care of device, Adjustment of device  Tonye Pearson 08/29/2023, 3:05 PM

## 2023-08-29 NOTE — ED Triage Notes (Signed)
BIB police for left ankle pain after injuring it when running from the cops. Pulses present, sensation intact, able to move toes

## 2023-08-29 NOTE — Discharge Instructions (Signed)
As we discussed, your workup in the ER today was reassuring for acute findings.  X-ray imaging of your ankle did not reveal any fracture or dislocation.  I suspect you have sustained a an ankle sprain from falling.  We have given you a brace to wear for support and recommend you rest, ice, compress, and elevate your foot and take Tylenol/ibuprofen as needed for pain.  I have given you a referral to orthopedics with a number to call to schedule appointment for follow-up.  Return if development of any new or worsening symptoms.

## 2023-11-23 ENCOUNTER — Other Ambulatory Visit: Payer: Self-pay | Admitting: Family Medicine

## 2023-11-23 DIAGNOSIS — N6310 Unspecified lump in the right breast, unspecified quadrant: Secondary | ICD-10-CM

## 2023-11-23 DIAGNOSIS — N6320 Unspecified lump in the left breast, unspecified quadrant: Secondary | ICD-10-CM

## 2023-11-24 ENCOUNTER — Emergency Department (HOSPITAL_COMMUNITY)
Admission: EM | Admit: 2023-11-24 | Discharge: 2023-11-24 | Discharge status: Against Medical Advice | Attending: Emergency Medicine | Admitting: Emergency Medicine

## 2023-11-24 ENCOUNTER — Other Ambulatory Visit: Payer: Self-pay

## 2023-11-24 DIAGNOSIS — Z5321 Procedure and treatment not carried out due to patient leaving prior to being seen by health care provider: Secondary | ICD-10-CM | POA: Diagnosis not present

## 2023-11-24 DIAGNOSIS — R112 Nausea with vomiting, unspecified: Secondary | ICD-10-CM | POA: Diagnosis present

## 2023-11-24 DIAGNOSIS — R197 Diarrhea, unspecified: Secondary | ICD-10-CM | POA: Diagnosis not present

## 2023-11-24 NOTE — ED Triage Notes (Signed)
 Pt called ems d/t feeling short of breath, feels like allergies, has not taken anything for it.  Pt states "a few days ago I was just laying down and it felt like something was grabbing my throat, my throat feels really sore and it hurts to swallow"  NAD noted, VSS

## 2023-12-08 ENCOUNTER — Encounter

## 2023-12-08 ENCOUNTER — Other Ambulatory Visit

## 2023-12-17 ENCOUNTER — Encounter (HOSPITAL_COMMUNITY): Payer: Self-pay | Admitting: *Deleted

## 2023-12-17 ENCOUNTER — Emergency Department (HOSPITAL_COMMUNITY): Payer: MEDICAID

## 2023-12-17 ENCOUNTER — Emergency Department (HOSPITAL_COMMUNITY)
Admission: EM | Admit: 2023-12-17 | Discharge: 2023-12-17 | Disposition: A | Discharge status: Home / Self Care | Payer: MEDICAID

## 2023-12-17 ENCOUNTER — Other Ambulatory Visit: Payer: Self-pay

## 2023-12-17 DIAGNOSIS — B9689 Other specified bacterial agents as the cause of diseases classified elsewhere: Secondary | ICD-10-CM | POA: Insufficient documentation

## 2023-12-17 DIAGNOSIS — N760 Acute vaginitis: Secondary | ICD-10-CM | POA: Insufficient documentation

## 2023-12-17 DIAGNOSIS — R1031 Right lower quadrant pain: Secondary | ICD-10-CM | POA: Diagnosis present

## 2023-12-17 DIAGNOSIS — D251 Intramural leiomyoma of uterus: Secondary | ICD-10-CM | POA: Diagnosis not present

## 2023-12-17 LAB — COMPREHENSIVE METABOLIC PANEL WITH GFR
ALT: 27 U/L (ref 0–44)
AST: 25 U/L (ref 15–41)
Albumin: 3.9 g/dL (ref 3.5–5.0)
Alkaline Phosphatase: 72 U/L (ref 38–126)
Anion gap: 9 (ref 5–15)
BUN: 12 mg/dL (ref 6–20)
CO2: 24 mmol/L (ref 22–32)
Calcium: 9.1 mg/dL (ref 8.9–10.3)
Chloride: 102 mmol/L (ref 98–111)
Creatinine, Ser: 0.85 mg/dL (ref 0.44–1.00)
GFR, Estimated: >60 mL/min (ref >60)
Glucose, Bld: 100 mg/dL — ABNORMAL HIGH (ref 70–99)
Potassium: 3.8 mmol/L (ref 3.5–5.1)
Sodium: 135 mmol/L (ref 135–145)
Total Bilirubin: 0.6 mg/dL (ref 0.0–1.2)
Total Protein: 7.5 g/dL (ref 6.5–8.1)

## 2023-12-17 LAB — CBC
HCT: 38.1 % (ref 36.0–46.0)
Hemoglobin: 11.5 g/dL — ABNORMAL LOW (ref 12.0–15.0)
MCH: 26.7 pg (ref 26.0–34.0)
MCHC: 30.2 g/dL (ref 30.0–36.0)
MCV: 88.6 fL (ref 80.0–100.0)
Platelets: 417 10*3/uL — ABNORMAL HIGH (ref 150–400)
RBC: 4.3 MIL/uL (ref 3.87–5.11)
RDW: 14.8 % (ref 11.5–15.5)
WBC: 6.6 10*3/uL (ref 4.0–10.5)
nRBC: 0 % (ref 0.0–0.2)

## 2023-12-17 LAB — URINALYSIS, ROUTINE W REFLEX MICROSCOPIC
Bilirubin Urine: NEGATIVE
Glucose, UA: NEGATIVE mg/dL
Hgb urine dipstick: NEGATIVE
Ketones, ur: NEGATIVE mg/dL
Leukocytes,Ua: NEGATIVE
Nitrite: NEGATIVE
Protein, ur: NEGATIVE mg/dL
Specific Gravity, Urine: 1.009 (ref 1.005–1.030)
pH: 6 (ref 5.0–8.0)

## 2023-12-17 LAB — LIPASE, BLOOD: Lipase: 35 U/L (ref 11–51)

## 2023-12-17 LAB — HCG, SERUM, QUALITATIVE: Preg, Serum: NEGATIVE

## 2023-12-17 MED ORDER — ONDANSETRON HCL 4 MG/2ML IJ SOLN
4.0000 mg | Freq: Once | INTRAMUSCULAR | Status: AC
  Administered 2023-12-17: 4 mg via INTRAVENOUS
  Filled 2023-12-17: qty 2

## 2023-12-17 MED ORDER — IOHEXOL 300 MG/ML  SOLN
100.0000 mL | Freq: Once | INTRAMUSCULAR | Status: AC | PRN
  Administered 2023-12-17: 100 mL via INTRAVENOUS

## 2023-12-17 MED ORDER — METRONIDAZOLE 500 MG PO TABS
500.0000 mg | ORAL_TABLET | Freq: Once | ORAL | Status: AC
  Administered 2023-12-17: 500 mg via ORAL
  Filled 2023-12-17: qty 1

## 2023-12-17 MED ORDER — KETOROLAC TROMETHAMINE 15 MG/ML IJ SOLN
15.0000 mg | Freq: Once | INTRAMUSCULAR | Status: AC
  Administered 2023-12-17: 15 mg via INTRAVENOUS
  Filled 2023-12-17: qty 1

## 2023-12-17 MED ORDER — ACETAMINOPHEN 500 MG PO TABS
1000.0000 mg | ORAL_TABLET | Freq: Once | ORAL | Status: AC
  Administered 2023-12-17: 1000 mg via ORAL
  Filled 2023-12-17: qty 2

## 2023-12-17 NOTE — ED Triage Notes (Signed)
 BIB EMS with rt lower quad pain, concerned it may be ovarian problems, dx yesterday with BV and has not started meds as of yet. 158/90-88-99% RA- CBG 117

## 2023-12-17 NOTE — Discharge Instructions (Addendum)
 You were evaluated in the emergency room for abdominal pain.  Your lab work did not show any significant abnormality.  Your imaging showed a uterine fibroid.  Please follow-up with your OB/GYN.
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
## 2023-12-21 ENCOUNTER — Other Ambulatory Visit

## 2023-12-21 ENCOUNTER — Encounter

## 2023-12-23 ENCOUNTER — Encounter

## 2023-12-23 ENCOUNTER — Other Ambulatory Visit

## 2023-12-28 ENCOUNTER — Ambulatory Visit
Admission: RE | Admit: 2023-12-28 | Discharge: 2023-12-28 | Disposition: A | Discharge status: Home / Self Care | Payer: MEDICAID | Source: Ambulatory Visit | Attending: Family Medicine | Admitting: Family Medicine

## 2023-12-28 ENCOUNTER — Ambulatory Visit
Admission: RE | Admit: 2023-12-28 | Discharge: 2023-12-28 | Disposition: A | Discharge status: Home / Self Care | Source: Ambulatory Visit | Attending: Family Medicine | Admitting: Family Medicine

## 2023-12-28 DIAGNOSIS — N6310 Unspecified lump in the right breast, unspecified quadrant: Secondary | ICD-10-CM

## 2024-02-29 ENCOUNTER — Emergency Department (HOSPITAL_COMMUNITY): Payer: MEDICAID

## 2024-02-29 ENCOUNTER — Emergency Department (HOSPITAL_COMMUNITY)
Admission: EM | Admit: 2024-02-29 | Discharge: 2024-02-29 | Discharge status: Against Medical Advice | Payer: MEDICAID | Attending: Emergency Medicine | Admitting: Emergency Medicine

## 2024-02-29 ENCOUNTER — Encounter (HOSPITAL_COMMUNITY): Payer: Self-pay

## 2024-02-29 DIAGNOSIS — M25572 Pain in left ankle and joints of left foot: Secondary | ICD-10-CM | POA: Diagnosis present

## 2024-02-29 DIAGNOSIS — Z5321 Procedure and treatment not carried out due to patient leaving prior to being seen by health care provider: Secondary | ICD-10-CM | POA: Diagnosis not present

## 2024-02-29 NOTE — ED Notes (Signed)
 Pt told registration she was leaving.

## 2024-02-29 NOTE — ED Triage Notes (Signed)
 Per EMS, Pt, from home, c/o L ankle pain.  Denies injury.  Pain score 7/10.  Hx of ankle fracture and hardware placement in 2017.  Pt given 650mg  Tylenol  en route w/ significant relief.

## 2024-03-02 ENCOUNTER — Other Ambulatory Visit: Payer: Self-pay

## 2024-03-02 ENCOUNTER — Encounter (HOSPITAL_BASED_OUTPATIENT_CLINIC_OR_DEPARTMENT_OTHER): Payer: Self-pay | Admitting: Emergency Medicine

## 2024-03-02 ENCOUNTER — Emergency Department (HOSPITAL_BASED_OUTPATIENT_CLINIC_OR_DEPARTMENT_OTHER)
Admission: EM | Admit: 2024-03-02 | Discharge: 2024-03-02 | Disposition: A | Discharge status: Home / Self Care | Payer: MEDICAID

## 2024-03-02 DIAGNOSIS — M25472 Effusion, left ankle: Secondary | ICD-10-CM | POA: Diagnosis not present

## 2024-03-02 DIAGNOSIS — M25572 Pain in left ankle and joints of left foot: Secondary | ICD-10-CM | POA: Insufficient documentation

## 2024-03-02 MED ORDER — KETOROLAC TROMETHAMINE 15 MG/ML IJ SOLN
15.0000 mg | Freq: Once | INTRAMUSCULAR | Status: AC
  Administered 2024-03-02: 15 mg via INTRAMUSCULAR
  Filled 2024-03-02: qty 1

## 2024-03-02 MED ORDER — DICLOFENAC SODIUM 1 % EX GEL: 2.0000 g | Freq: Four times a day (QID) | CUTANEOUS | 0 refills | Status: AC

## 2024-03-02 MED ORDER — OXYCODONE-ACETAMINOPHEN 5-325 MG PO TABS
2.0000 | ORAL_TABLET | Freq: Once | ORAL | Status: AC
  Administered 2024-03-02: 2 via ORAL
  Filled 2024-03-02: qty 2

## 2024-03-02 NOTE — ED Triage Notes (Signed)
 BIB [facility] , Recurrent and chronic left ankle pain , denies new injury .

## 2024-03-02 NOTE — ED Notes (Addendum)
 Pt alert and oriented X 4 at the time of discharge. RR even and unlabored. No acute distress noted. Pt verbalized understanding of discharge instructions as discussed. Pt in wheelchair to lobby at time of discharge.

## 2024-03-02 NOTE — ED Provider Notes (Signed)
 [place] EMERGENCY DEPARTMENT AT [facility] [place] Provider Note   CSN: [id number] Arrival date & time: 03/02/24  1155     Patient presents with: Ankle Pain (left)   Nicole Manning is a 38 y.o. female patient presents to the emergency department today for further evaluation of left ankle pain.  Patient does have chronic pain in that left ankle secondary to ORIF that was done many years ago.  She states that she was doing a lot of walking in the day after that woke up with some pain.  Pain has been increasing over the last 3 days to the point where she can no longer ambulate without it being painful.  She has been taking ibuprofen  intermittently with some relief.  It is improved with wearing a cam boot.  She denies any new injury or twisting of the ankle.    Ankle Pain      Prior to Admission medications   Medication Sig Start Date End Date Taking? Authorizing Provider  diclofenac  Sodium (VOLTAREN ) 1 % GEL Apply 2 g topically 4 (four) times daily. 03/02/24  Yes Theotis, Emberlee Sortino M, PA-C  benzonatate  (TESSALON ) 100 MG capsule Take 1 capsule (100 mg total) by mouth every 8 (eight) hours. 12/02/20   Brien Therisa MATSU, MD  benztropine  (COGENTIN ) 1 MG tablet Take 1 tablet (1 mg total) by mouth 2 (two) times daily. 03/22/19   Malinda Rogue, MD  cyclobenzaprine  (FLEXERIL ) 10 MG tablet Take 1 tablet (10 mg total) by mouth 3 (three) times daily as needed for muscle spasms. 05/25/21   Geroldine Berg, MD  DULoxetine (CYMBALTA) 30 MG capsule Take 60 mg by mouth at bedtime.    [provider]  HYDROcodone -acetaminophen  (NORCO/VICODIN) 5-325 MG tablet Take 2 tablets by mouth every 4 (four) hours as needed. 04/01/23   Soto, Johana, PA-C  HYDROcodone -homatropine (HYCODAN) 5-1.5 MG/5ML syrup Take 5 mLs by mouth every 6 (six) hours as needed for cough. 12/02/20   Brien Therisa MATSU, MD  ibuprofen  (ADVIL ) 600 MG tablet Take 1 tablet (600 mg total) by mouth every 8 (eight) hours as needed. 03/19/21    Tammy Sor, PA-C  metroNIDAZOLE  (FLAGYL ) 500 MG tablet Take 1 tablet (500 mg total) by mouth 2 (two) times daily. One po bid x 7 days 05/25/21   Geroldine Berg, MD  naproxen  (NAPROSYN ) 500 MG tablet Take 1 tablet (500 mg total) by mouth 2 (two) times daily. 09/30/21   Geroldine Berg, MD  omeprazole  (PRILOSEC) 20 MG capsule Take 1 capsule (20 mg total) by mouth daily. 06/01/20 07/01/20  Walisiewicz, Kaitlyn E, PA-C  risperiDONE  (RISPERDAL ) 2 MG tablet Take 2 mg by mouth at bedtime.    [provider]  risperiDONE  (RISPERDAL ) 3 MG tablet Take 2 tablets (6 mg total) by mouth at bedtime. Patient taking differently: Take 3 mg by mouth at bedtime.  03/22/19   Malinda Rogue, MD  sertraline (ZOLOFT) 50 MG tablet Take 50 mg by mouth daily. 01/13/19   [provider]  topiramate  (TOPAMAX ) 25 MG tablet Take 50 mg by mouth 2 (two) times daily.  05/09/19   [provider]  trihexyphenidyl (ARTANE) 2 MG tablet Take 2 mg by mouth at bedtime.    [provider]  albuterol  (PROVENTIL  HFA;VENTOLIN  HFA) 108 (90 Base) MCG/ACT inhaler Inhale 2 puffs into the lungs every 6 (six) hours as needed for wheezing or shortness of breath. Patient not taking: Reported on 09/06/2019 08/26/18 09/25/19  Pearl Jenkins Lesches, NP    Allergies:  Patient has no known allergies.    Review of Systems  All other systems reviewed and are negative.   Updated Vital Signs BP 111/80   Pulse 83   Temp 97.7 F (36.5 C) (Oral)   Resp 16   LMP 01/19/2024 (Approximate)   SpO2 100%   Physical Exam Vitals and nursing note reviewed.  Constitutional:      Appearance: Normal appearance.  HENT:     Head: Normocephalic and atraumatic.   Eyes:     General:        Right eye: No discharge.        Left eye: No discharge.     Conjunctiva/sclera: Conjunctivae normal.   Pulmonary:     Effort: Pulmonary effort is normal.   Musculoskeletal:     Comments: 2+ dorsalis pedis pulse felt in the left foot and equal in  comparison to the right.  She does have some chronic swelling over the medial malleolus.  There is no obvious redness, drainage, or significant warmth over the area.   Skin:    General: Skin is warm and dry.     Findings: No rash.   Neurological:     General: No focal deficit present.     Mental Status: She is alert.   Psychiatric:        Mood and Affect: Mood normal.        Behavior: Behavior normal.     (all labs ordered are listed, but only abnormal results are displayed) Labs Reviewed - No data to display  EKG: None  Radiology: DG Ankle Complete Left Result Date: 02/29/2024 CLINICAL DATA:  Ankle pain. No history of recent trauma. Previous surgery. EXAM: LEFT ANKLE COMPLETE - 3 VIEW COMPARISON:  X-ray 08/29/2023 and older. FINDINGS: ORIF with fixation plate and screws along the distal tibia. No hardware failure. Degenerative changes of the ankle joint with osteophyte formation. Hyperostosis seen throughout the hind and midfoot. Mild soft tissue swelling. No acute fracture or dislocation. IMPRESSION: Stable ORIF of the distal tibia.  No hardware failure. Degenerative changes of the ankle and visible midfoot. Electronically Signed   By: Ranell Bring M.D.   On: 02/29/2024 14:15     Procedures   Medications Ordered in the ED  ketorolac  (TORADOL ) 15 MG/ML injection 15 mg (has no administration in time range)  oxyCODONE -acetaminophen  (PERCOCET/ROXICET) 5-325 MG per tablet 2 tablet (has no administration in time range)       Medical Decision Making Nicole Manning is a 38 y.o. female patient who presents to the emergency department today for further evaluation of left-sided ankle pain.  Low suspicion for gout at this time.  This is likely just an acute exacerbation of chronic ankle pain.  I reviewed the imaging that was performed at [facility] and went over it with the patient at bedside as she left before being seen.  X-ray was normal.  Hardware seems to be intact.  I do agree  with radiologist interpretation.  We are going to treat conservatively with 600 mg of I Profen every 6 hours for the next 3 to 4 days.  I am also going to prescribe her some Voltaren  gel to go home with.  She will continue using her cam boot.  I will have her follow-up with orthopedist for further evaluation.  Strict turn precautions were discussed.  She is safe for discharge.   Risk Prescription drug management.     Final diagnoses:  None    ED Discharge Orders  Ordered    diclofenac  Sodium (VOLTAREN ) 1 % GEL  4 times daily        03/02/24 1254               Marae, Cottrell [place], [place] 03/02/24 1256    Ula Prentice SAUNDERS, MD 03/02/24 1407

## 2024-03-02 NOTE — Discharge Instructions (Addendum)
 Continue taking 600 mg of ibuprofen  every 6 hours for the next 3 to 4 days.  You could also use the Voltaren  gel that I have prescribed.  I would like for you to follow-up with your orthopedist for further evaluation.  Continue using the CAM boot until pain resolves.  You may return to the emergency department for any worsening symptoms.

## 2024-05-30 ENCOUNTER — Encounter (HOSPITAL_COMMUNITY): Payer: Self-pay | Admitting: *Deleted

## 2024-05-30 ENCOUNTER — Ambulatory Visit (HOSPITAL_COMMUNITY)
Admission: EM | Admit: 2024-05-30 | Discharge: 2024-05-30 | Disposition: A | Discharge status: Home / Self Care | Payer: MEDICAID

## 2024-05-30 DIAGNOSIS — L299 Pruritus, unspecified: Secondary | ICD-10-CM | POA: Diagnosis not present

## 2024-05-30 DIAGNOSIS — N898 Other specified noninflammatory disorders of vagina: Secondary | ICD-10-CM | POA: Insufficient documentation

## 2024-05-30 DIAGNOSIS — B9689 Other specified bacterial agents as the cause of diseases classified elsewhere: Secondary | ICD-10-CM | POA: Insufficient documentation

## 2024-05-30 DIAGNOSIS — A5901 Trichomonal vulvovaginitis: Secondary | ICD-10-CM | POA: Insufficient documentation

## 2024-05-30 DIAGNOSIS — Z202 Contact with and (suspected) exposure to infections with a predominantly sexual mode of transmission: Secondary | ICD-10-CM | POA: Insufficient documentation

## 2024-05-30 NOTE — ED Triage Notes (Signed)
 Pt states she thinks that she has a yeast infection, she has vaginal itching X 1 week. She states worse when she wipes. She used OTC yeast infection cream without relief.
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
## 2024-05-30 NOTE — Discharge Instructions (Addendum)
 Check my chart for results. Avoid sexual activity until results,treatment known and completed. Safe sex with all future sexual activity. We have sent testing for sexually transmitted infections. We will notify you of any positive results once they are received. If required, we will prescribe any medications you might need.  Will not be notified if your results are negative

## 2024-05-31 ENCOUNTER — Ambulatory Visit (HOSPITAL_COMMUNITY): Payer: Self-pay

## 2024-05-31 LAB — CERVICOVAGINAL ANCILLARY ONLY
Bacterial Vaginitis (gardnerella): POSITIVE — AB
Candida Glabrata: NEGATIVE
Candida Vaginitis: NEGATIVE
Chlamydia: NEGATIVE
Comment: NEGATIVE
Comment: NEGATIVE
Comment: NEGATIVE
Comment: NEGATIVE
Comment: NEGATIVE
Comment: NORMAL
Neisseria Gonorrhea: NEGATIVE
Trichomonas: NEGATIVE

## 2024-05-31 MED ORDER — METRONIDAZOLE 500 MG PO TABS: 500.0000 mg | ORAL_TABLET | Freq: Two times a day (BID) | ORAL | 0 refills | Status: AC

## 2024-06-01 MED ORDER — METRONIDAZOLE 0.75 % VA GEL: 1.0000 | Freq: Every day | VAGINAL | 0 refills | Status: AC

## 2024-06-09 ENCOUNTER — Ambulatory Visit
Admission: EM | Admit: 2024-06-09 | Discharge: 2024-06-09 | Disposition: A | Discharge status: Home / Self Care | Payer: MEDICAID | Attending: Family Medicine | Admitting: Family Medicine

## 2024-06-09 DIAGNOSIS — N898 Other specified noninflammatory disorders of vagina: Secondary | ICD-10-CM

## 2024-06-09 DIAGNOSIS — Z113 Encounter for screening for infections with a predominantly sexual mode of transmission: Secondary | ICD-10-CM | POA: Diagnosis not present

## 2024-06-09 LAB — POCT URINE DIPSTICK
Bilirubin, UA: NEGATIVE
Glucose, UA: 500 mg/dL — AB
Leukocytes, UA: NEGATIVE
Nitrite, UA: NEGATIVE
POC PROTEIN,UA: NEGATIVE
Spec Grav, UA: >=1.03 — AB (ref 1.010–1.025)
Urobilinogen, UA: 0.2 U/dL
pH, UA: 5.5 (ref 5.0–8.0)

## 2024-06-09 LAB — POCT URINE PREGNANCY: Preg Test, Ur: NEGATIVE

## 2024-06-09 MED ORDER — FLUCONAZOLE 150 MG PO TABS: 150.0000 mg | ORAL_TABLET | Freq: Every day | ORAL | 0 refills | Status: AC

## 2024-06-09 NOTE — ED Triage Notes (Signed)
 Pt present with c/o vaginal issues. States she still has vaginal itching and discomfort. Pt states she is concerned her PH Is off.
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
## 2024-06-09 NOTE — Discharge Instructions (Addendum)
 The clinical contact you with results of the testing done today if positive.  Start Diflucan  as prescribed.  Follow-up with your PCP or gynecologist if your symptoms do not improve.  Please go to the ER for any worsening symptoms.  Hope you feel better soon!

## 2024-06-10 LAB — RPR: RPR Ser Ql: NONREACTIVE

## 2024-06-10 LAB — HIV ANTIBODY (ROUTINE TESTING W REFLEX): HIV Screen 4th Generation wRfx: NONREACTIVE

## 2024-06-12 ENCOUNTER — Ambulatory Visit (HOSPITAL_COMMUNITY): Payer: Self-pay

## 2024-06-12 LAB — CERVICOVAGINAL ANCILLARY ONLY
Bacterial Vaginitis (gardnerella): NEGATIVE
Candida Glabrata: NEGATIVE
Candida Vaginitis: POSITIVE — AB
Chlamydia: NEGATIVE
Comment: NEGATIVE
Comment: NEGATIVE
Comment: NEGATIVE
Comment: NEGATIVE
Comment: NEGATIVE
Comment: NORMAL
Neisseria Gonorrhea: NEGATIVE
Trichomonas: NEGATIVE

## 2024-07-01 ENCOUNTER — Emergency Department (HOSPITAL_BASED_OUTPATIENT_CLINIC_OR_DEPARTMENT_OTHER)
Admission: EM | Admit: 2024-07-01 | Discharge: 2024-07-01 | Disposition: A | Discharge status: Home / Self Care | Payer: MEDICAID

## 2024-07-01 ENCOUNTER — Encounter (HOSPITAL_BASED_OUTPATIENT_CLINIC_OR_DEPARTMENT_OTHER): Payer: Self-pay

## 2024-07-01 DIAGNOSIS — J40 Bronchitis, not specified as acute or chronic: Secondary | ICD-10-CM | POA: Diagnosis not present

## 2024-07-01 DIAGNOSIS — F17210 Nicotine dependence, cigarettes, uncomplicated: Secondary | ICD-10-CM | POA: Insufficient documentation

## 2024-07-01 DIAGNOSIS — E119 Type 2 diabetes mellitus without complications: Secondary | ICD-10-CM | POA: Diagnosis not present

## 2024-07-01 DIAGNOSIS — R059 Cough, unspecified: Secondary | ICD-10-CM | POA: Diagnosis present

## 2024-07-01 LAB — RESP PANEL BY RT-PCR (RSV, FLU A&B, COVID)  RVPGX2
Influenza A by PCR: NEGATIVE
Influenza B by PCR: NEGATIVE
Resp Syncytial Virus by PCR: NEGATIVE
SARS Coronavirus 2 by RT PCR: NEGATIVE

## 2024-07-01 MED ORDER — AZITHROMYCIN 250 MG PO TABS: 250.0000 mg | ORAL_TABLET | Freq: Every day | ORAL | 0 refills | Status: AC

## 2024-07-01 MED ORDER — PSEUDOEPH-BROMPHEN-DM 30-2-10 MG/5ML PO SYRP: 5.0000 mL | ORAL_SOLUTION | Freq: Four times a day (QID) | ORAL | 0 refills | Status: AC | PRN

## 2024-07-01 NOTE — ED Provider Notes (Signed)
 [place] [facility] Provider Note   CSN: [id number] Arrival date & time: 07/01/24  1046     Patient presents with: Cough   Nicole Manning is a 38 y.o. female.   38 year old female past medical history of schizophrenia and diabetes who does smoke cigarettes presenting to the emergency department today with productive cough.  The patient states she has had a productive cough now for the past week or so.  She denies any significant shortness of breath.  Denies any leg pain or swelling.  States that she does smoke cigarettes daily.  She denies any significant wheezing.  She came to the ER today further evaluation regarding this due to ongoing symptoms.   Cough      Prior to Admission medications   Medication Sig Start Date End Date Taking? Authorizing Provider  azithromycin  (ZITHROMAX ) 250 MG tablet Take 1 tablet (250 mg total) by mouth daily. Take first 2 tablets together, then 1 every day until finished. 07/01/24  Yes Ula Prentice SAUNDERS, MD  brompheniramine-pseudoephedrine-DM 30-2-10 MG/5ML syrup Take 5 mLs by mouth 4 (four) times daily as needed (Cough). 07/01/24  Yes Ula Prentice SAUNDERS, MD  ALPRAZolam (XANAX) 1 MG tablet 1 tablet Orally Twice a day As needed    [provider]  benzonatate  (TESSALON ) 100 MG capsule Take 1 capsule (100 mg total) by mouth every 8 (eight) hours. 12/02/20   Brien Therisa MATSU, MD  benztropine  (COGENTIN ) 1 MG tablet Take 1 tablet (1 mg total) by mouth 2 (two) times daily. 03/22/19   Malinda Rogue, MD  cyclobenzaprine  (FLEXERIL ) 10 MG tablet Take 1 tablet (10 mg total) by mouth 3 (three) times daily as needed for muscle spasms. 05/25/21   Geroldine Berg, MD  diclofenac  Sodium (VOLTAREN ) 1 % GEL Apply 2 g topically 4 (four) times daily. 03/02/24   Theotis Peers HERO, PA-C  DULoxetine (CYMBALTA) 30 MG capsule Take 60 mg by mouth at bedtime.    [provider]  ferrous sulfate 325 (65 FE) MG tablet 1 tablet Orally every  other day    [provider]  gabapentin  (NEURONTIN ) 300 MG capsule 1 capsule.    [provider]  HYDROcodone -acetaminophen  (NORCO/VICODIN) 5-325 MG tablet Take 2 tablets by mouth every 4 (four) hours as needed. 04/01/23   Soto, Johana, PA-C  HYDROcodone -homatropine (HYCODAN) 5-1.5 MG/5ML syrup Take 5 mLs by mouth every 6 (six) hours as needed for cough. 12/02/20   Brien Therisa MATSU, MD  ibuprofen  (ADVIL ) 600 MG tablet Take 1 tablet (600 mg total) by mouth every 8 (eight) hours as needed. 03/19/21   Tammy Sor, PA-C  naproxen  (NAPROSYN ) 500 MG tablet Take 1 tablet (500 mg total) by mouth 2 (two) times daily. 09/30/21   Geroldine Berg, MD  omeprazole  (PRILOSEC) 20 MG capsule Take 1 capsule (20 mg total) by mouth daily. 06/01/20 07/01/20  Walisiewicz, Kaitlyn E, PA-C  risperiDONE  (RISPERDAL ) 2 MG tablet Take 2 mg by mouth at bedtime.    [provider]  risperiDONE  (RISPERDAL ) 3 MG tablet Take 2 tablets (6 mg total) by mouth at bedtime. Patient taking differently: Take 3 mg by mouth at bedtime. 03/22/19   Malinda Rogue, MD  sertraline (ZOLOFT) 50 MG tablet Take 50 mg by mouth daily. 01/13/19   [provider]  SYNJARDY XR 25-1000 MG TB24 Take 1 tablet by mouth every morning.    [provider]  topiramate  (TOPAMAX ) 25 MG tablet Take 50 mg by mouth 2 (two)  times daily.  05/09/19   [provider]  trihexyphenidyl (ARTANE) 2 MG tablet Take 2 mg by mouth at bedtime.    [provider]  albuterol  (PROVENTIL  HFA;VENTOLIN  HFA) 108 (90 Base) MCG/ACT inhaler Inhale 2 puffs into the lungs every 6 (six) hours as needed for wheezing or shortness of breath. Patient not taking: Reported on 09/06/2019 08/26/18 09/25/19  Pearl Jenkins Lesches, NP    Allergies: Patient has no known allergies.    Review of Systems  Respiratory:  Positive for cough.   All other systems reviewed and are negative.   Updated Vital Signs BP 122/89 (BP Location: Right Arm)   Pulse 64    Temp 98.4 F (36.9 C)   Resp 18   Ht 5' 1 (1.549 m)   Wt 90.7 kg   LMP 06/07/2024 (Approximate)   SpO2 99%   BMI 37.79 kg/m   Physical Exam Vitals and nursing note reviewed.   Gen: NAD Eyes: PERRL, EOMI HEENT: no oropharyngeal swelling Neck: trachea midline Resp: clear to auscultation bilaterally Card: RRR, no murmurs, rubs, or gallops Abd: nontender, nondistended Extremities: no calf tenderness, no edema Vascular: 2+ radial pulses bilaterally, 2+ DP pulses bilaterally Skin: no rashes Psyc: acting appropriately   (all labs ordered are listed, but only abnormal results are displayed) Labs Reviewed  RESP PANEL BY RT-PCR (RSV, FLU A&B, COVID)  [id number]    EKG: None  Radiology: No results found.   Procedures   Medications Ordered in the ED - No data to display                                  Medical Decision Making 38 year old female past medical history of schizophrenia and diabetes presenting to the emergency department today with cough.  Patient was lungs are relatively clear urine.  She is reporting cough significant with a lot of sputum as well as sore throat.  Given the patient's smoking history I will cover her with azithromycin  for bronchitis.  Discussed steroids and albuterol  with the patient declines.  Her pulse ox is 99% on room air and her vital signs are stable.  Think she is stable for discharge.  Risk Prescription drug management.        Final diagnoses:  Bronchitis    ED Discharge Orders          Ordered    azithromycin  (ZITHROMAX ) 250 MG tablet  Daily        07/01/24 1157    brompheniramine-pseudoephedrine-DM 30-2-10 MG/5ML syrup  4 times daily PRN        07/01/24 1157               Ula Prentice SAUNDERS, MD 07/01/24 1200

## 2024-07-01 NOTE — Discharge Instructions (Signed)
 Take the antibiotic as prescribed as well as the cough medication as needed.  Follow-up with your doctor and return to the ER for worsening symptoms.

## 2024-07-01 NOTE — ED Triage Notes (Signed)
 Pt bib [facility], pt originally said 2 days of a cough deep in her chest. In route said that it had been a week. Productive bubbly phlegm. No other symptoms. Vitals WNL. Hx DM cbg 133.

## 2024-07-04 ENCOUNTER — Ambulatory Visit
Admission: EM | Admit: 2024-07-04 | Discharge: 2024-07-04 | Disposition: A | Discharge status: Home / Self Care | Payer: MEDICAID | Attending: Family Medicine | Admitting: Family Medicine

## 2024-07-04 DIAGNOSIS — N760 Acute vaginitis: Secondary | ICD-10-CM | POA: Diagnosis present

## 2024-07-04 MED ORDER — FLUCONAZOLE 150 MG PO TABS: 150.0000 mg | ORAL_TABLET | Freq: Every day | ORAL | 0 refills | Status: AC

## 2024-07-04 NOTE — Discharge Instructions (Addendum)
 The clinic will contact you with results of the vaginal swab done today if positive.  Start Diflucan  as prescribed.  Follow-up with your PCP or gynecologist if your symptoms are not improving.  Please go to the ER for any worsening symptoms.  Hope you feel better soon!
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
## 2024-07-04 NOTE — ED Triage Notes (Signed)
 Pt present with c/o possible yeast infection. Pt states she was recently on abx and developed vaginal itching and discomfort. Pt states she can see discharge when she wipes.

## 2024-07-05 ENCOUNTER — Ambulatory Visit (HOSPITAL_COMMUNITY): Payer: Self-pay

## 2024-07-05 LAB — CERVICOVAGINAL ANCILLARY ONLY
Bacterial Vaginitis (gardnerella): POSITIVE — AB
Candida Glabrata: NEGATIVE
Candida Vaginitis: POSITIVE — AB
Comment: NEGATIVE
Comment: NEGATIVE
Comment: NEGATIVE

## 2024-07-05 MED ORDER — METRONIDAZOLE 500 MG PO TABS: 500.0000 mg | ORAL_TABLET | Freq: Two times a day (BID) | ORAL | 0 refills | Status: DC

## 2024-07-20 ENCOUNTER — Ambulatory Visit
Admission: EM | Admit: 2024-07-20 | Discharge: 2024-07-20 | Disposition: A | Discharge status: Home / Self Care | Payer: MEDICAID | Attending: Family Medicine | Admitting: Family Medicine

## 2024-07-20 DIAGNOSIS — N760 Acute vaginitis: Secondary | ICD-10-CM | POA: Diagnosis present

## 2024-07-20 DIAGNOSIS — N898 Other specified noninflammatory disorders of vagina: Secondary | ICD-10-CM | POA: Diagnosis present

## 2024-07-20 MED ORDER — FLUCONAZOLE 150 MG PO TABS: 150.0000 mg | ORAL_TABLET | ORAL | 0 refills | Status: DC
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
## 2024-07-20 NOTE — ED Triage Notes (Signed)
 Pt reports vaginal itching and discomfort x 2 days. Reports she was taking antibiotics and she always has yeast infection after.

## 2024-07-20 NOTE — Discharge Instructions (Signed)
 Your test results should be available tomorrow.  Our results team will let you know if you need any kind of treatment for any positive test results. Otherwise, just take fluconazole  for antibiotic associated yeast infection.

## 2024-07-21 ENCOUNTER — Ambulatory Visit (HOSPITAL_COMMUNITY): Payer: Self-pay

## 2024-07-21 LAB — CERVICOVAGINAL ANCILLARY ONLY
Bacterial Vaginitis (gardnerella): POSITIVE — AB
Candida Glabrata: POSITIVE — AB
Candida Vaginitis: POSITIVE — AB
Chlamydia: NEGATIVE
Comment: NEGATIVE
Comment: NEGATIVE
Comment: NEGATIVE
Comment: NEGATIVE
Comment: NEGATIVE
Comment: NORMAL
Neisseria Gonorrhea: NEGATIVE
Trichomonas: NEGATIVE

## 2024-07-21 MED ORDER — METRONIDAZOLE 500 MG PO TABS: 500.0000 mg | ORAL_TABLET | Freq: Two times a day (BID) | ORAL | 0 refills | Status: AC

## 2024-07-23 LAB — MISC LABCORP TEST (SEND OUT): Labcorp test code: 180076

## 2024-08-26 ENCOUNTER — Ambulatory Visit
Admission: EM | Admit: 2024-08-26 | Discharge: 2024-08-26 | Disposition: A | Discharge status: Home / Self Care | Payer: MEDICAID | Attending: Family Medicine | Admitting: Family Medicine

## 2024-08-26 DIAGNOSIS — N760 Acute vaginitis: Secondary | ICD-10-CM | POA: Insufficient documentation

## 2024-08-26 MED ORDER — FLUCONAZOLE 150 MG PO TABS: 150.0000 mg | ORAL_TABLET | ORAL | 0 refills | Status: AC

## 2024-08-26 NOTE — ED Triage Notes (Signed)
 Pt c/o vaginal d/c and irritation started yesterday-states she feels is another yeast infection-NAD-steady gait
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
## 2024-08-28 ENCOUNTER — Ambulatory Visit (HOSPITAL_COMMUNITY): Payer: Self-pay

## 2024-08-28 LAB — CERVICOVAGINAL ANCILLARY ONLY
Bacterial Vaginitis (gardnerella): POSITIVE — AB
Candida Glabrata: POSITIVE — AB
Candida Vaginitis: POSITIVE — AB
Chlamydia: NEGATIVE
Comment: NEGATIVE
Comment: NEGATIVE
Comment: NEGATIVE
Comment: NEGATIVE
Comment: NEGATIVE
Comment: NORMAL
Neisseria Gonorrhea: NEGATIVE
Trichomonas: NEGATIVE

## 2024-08-28 MED ORDER — METRONIDAZOLE 500 MG PO TABS: 500.0000 mg | ORAL_TABLET | Freq: Two times a day (BID) | ORAL | 0 refills | Status: AC

## 2024-09-05 ENCOUNTER — Ambulatory Visit
Admission: EM | Admit: 2024-09-05 | Discharge: 2024-09-05 | Disposition: A | Discharge status: Home / Self Care | Payer: MEDICAID | Attending: Family Medicine | Admitting: Family Medicine

## 2024-09-05 DIAGNOSIS — N898 Other specified noninflammatory disorders of vagina: Secondary | ICD-10-CM | POA: Insufficient documentation

## 2024-09-05 NOTE — ED Triage Notes (Signed)
 Pt c/o a sore to outer vaginal area x today-NAD-steady gait

## 2024-09-05 NOTE — Discharge Instructions (Addendum)
 Our results team will reach out to you to confirm your results in 1-2 days.
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
## 2024-09-06 ENCOUNTER — Ambulatory Visit (HOSPITAL_COMMUNITY): Payer: Self-pay

## 2024-09-06 LAB — HSV 1/2 PCR (SURFACE)
HSV-1 DNA: NOT DETECTED
HSV-2 DNA: DETECTED — AB

## 2024-09-06 MED ORDER — VALACYCLOVIR HCL 1 G PO TABS: 1000.0000 mg | ORAL_TABLET | Freq: Two times a day (BID) | ORAL | 0 refills | Status: AC

## 2024-09-22 ENCOUNTER — Emergency Department (HOSPITAL_COMMUNITY): Payer: MEDICAID

## 2024-09-22 ENCOUNTER — Encounter (HOSPITAL_COMMUNITY): Payer: Self-pay

## 2024-09-22 ENCOUNTER — Emergency Department (HOSPITAL_COMMUNITY)
Admission: EM | Admit: 2024-09-22 | Discharge: 2024-09-22 | Disposition: A | Discharge status: Home / Self Care | Payer: MEDICAID | Attending: Emergency Medicine | Admitting: Emergency Medicine

## 2024-09-22 ENCOUNTER — Other Ambulatory Visit: Payer: Self-pay

## 2024-09-22 DIAGNOSIS — W1830XA Fall on same level, unspecified, initial encounter: Secondary | ICD-10-CM | POA: Insufficient documentation

## 2024-09-22 DIAGNOSIS — E119 Type 2 diabetes mellitus without complications: Secondary | ICD-10-CM | POA: Insufficient documentation

## 2024-09-22 DIAGNOSIS — M25571 Pain in right ankle and joints of right foot: Secondary | ICD-10-CM | POA: Diagnosis present

## 2024-09-22 DIAGNOSIS — R456 Violent behavior: Secondary | ICD-10-CM | POA: Diagnosis not present

## 2024-09-22 DIAGNOSIS — S99911A Unspecified injury of right ankle, initial encounter: Secondary | ICD-10-CM | POA: Diagnosis not present

## 2024-09-22 DIAGNOSIS — Y9302 Activity, running: Secondary | ICD-10-CM | POA: Insufficient documentation

## 2024-09-22 DIAGNOSIS — R451 Restlessness and agitation: Secondary | ICD-10-CM | POA: Diagnosis not present

## 2024-09-22 MED ORDER — KETOROLAC TROMETHAMINE 15 MG/ML IJ SOLN
15.0000 mg | Freq: Once | INTRAMUSCULAR | Status: AC
  Administered 2024-09-22: 15 mg via INTRAMUSCULAR
  Filled 2024-09-22: qty 1

## 2024-09-22 MED ORDER — ZIPRASIDONE MESYLATE 20 MG IM SOLR
10.0000 mg | Freq: Once | INTRAMUSCULAR | Status: DC
  Filled 2024-09-22: qty 20

## 2024-09-22 NOTE — ED Notes (Signed)
 Pt yelling and cursing at staff and PD at bedside.  Ice pack applied to right ankle

## 2024-09-22 NOTE — ED Notes (Signed)
 Pt to xray

## 2024-09-22 NOTE — ED Triage Notes (Signed)
 Pt BIBA s/p fall while running from PD. Pt now reporting right ankle pain. Pt denies LOC, denies hitting head, denies neck or back pain. No obvious deformity noted, CMS intact, mild swelling noted

## 2024-09-22 NOTE — ED Provider Notes (Signed)
 " [place] EMERGENCY DEPARTMENT AT [facility] Provider Note   CSN: [id number] Arrival date & time: 09/22/24  2203     Patient presents with: Ankle Pain   Nicole Manning is a 39 y.o. female.  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  Patient reports right ankle pain.  Patient denies head injury, back pain, neck pain, or LOC.  Patient endorses pain and swelling.    Ankle Pain      Prior to Admission medications  Medication Sig Start Date End Date Taking? Authorizing Provider  ALPRAZolam (XANAX) 1 MG tablet 1 tablet Orally Twice a day As needed    [provider]  azithromycin  (ZITHROMAX ) 250 MG tablet Take 1 tablet (250 mg total) by mouth daily. Take first 2 tablets together, then 1 every day until finished. 07/01/24   Ula Prentice SAUNDERS, MD  benzonatate  (TESSALON ) 100 MG capsule Take 1 capsule (100 mg total) by mouth every 8 (eight) hours. 12/02/20   Brien Therisa MATSU, MD  benztropine  (COGENTIN ) 1 MG tablet Take 1 tablet (1 mg total) by mouth 2 (two) times daily. 03/22/19   Malinda Rogue, MD  brompheniramine-pseudoephedrine-DM 30-2-10 MG/5ML syrup Take 5 mLs by mouth 4 (four) times daily as needed (Cough). 07/01/24   Ula Prentice SAUNDERS, MD  cyclobenzaprine  (FLEXERIL ) 10 MG tablet Take 1 tablet (10 mg total) by mouth 3 (three) times daily as needed for muscle spasms. 05/25/21   Geroldine Berg, MD  diclofenac  Sodium (VOLTAREN ) 1 % GEL Apply 2 g topically 4 (four) times daily. 03/02/24   Theotis Peers HERO, PA-C  DULoxetine (CYMBALTA) 30 MG capsule Take 60 mg by mouth at bedtime.    [provider]  ferrous sulfate 325 (65 FE) MG tablet 1 tablet Orally every other day    [provider]  fluconazole  (DIFLUCAN ) 150 MG tablet Take 1 tablet (150 mg total) by mouth every 3 (three) days. 08/26/24   Christopher Savannah, PA-C  gabapentin  (NEURONTIN ) 300 MG capsule 1 capsule.     [provider]  HYDROcodone -acetaminophen  (NORCO/VICODIN) 5-325 MG tablet Take 2 tablets by mouth every 4 (four) hours as needed. 04/01/23   Soto, Johana, PA-C  HYDROcodone -homatropine (HYCODAN) 5-1.5 MG/5ML syrup Take 5 mLs by mouth every 6 (six) hours as needed for cough. 12/02/20   Brien Therisa MATSU, MD  ibuprofen  (ADVIL ) 600 MG tablet Take 1 tablet (600 mg total) by mouth every 8 (eight) hours as needed. 03/19/21   Tammy Sor, PA-C  naproxen  (NAPROSYN ) 500 MG tablet Take 1 tablet (500 mg total) by mouth 2 (two) times daily. 09/30/21   Geroldine Berg, MD  omeprazole  (PRILOSEC) 20 MG capsule Take 1 capsule (20 mg total) by mouth daily. 06/01/20 07/01/20  Walisiewicz, Kaitlyn E, PA-C  risperiDONE  (RISPERDAL ) 2 MG tablet Take 2 mg by mouth at bedtime.    [provider]  risperiDONE  (RISPERDAL ) 3 MG tablet Take 2 tablets (6 mg total) by mouth at bedtime. Patient taking differently: Take 3 mg by mouth at bedtime. 03/22/19   Malinda Rogue, MD  sertraline (ZOLOFT) 50 MG tablet Take 50 mg by mouth daily. 01/13/19   [provider]  SYNJARDY XR 25-1000 MG TB24 Take 1 tablet by mouth every morning.    [provider]  topiramate  (TOPAMAX ) 25 MG tablet Take 50 mg by mouth 2 (two) times daily.  05/09/19   [provider]  trihexyphenidyl (ARTANE) 2 MG tablet  Take 2 mg by mouth at bedtime.    [provider]  albuterol  (PROVENTIL  HFA;VENTOLIN  HFA) 108 (90 Base) MCG/ACT inhaler Inhale 2 puffs into the lungs every 6 (six) hours as needed for wheezing or shortness of breath. Patient not taking: Reported on 09/06/2019 08/26/18 09/25/19  Pearl Jenkins Lesches, NP    Allergies: Patient has no known allergies.    Review of Systems  Musculoskeletal:  Positive for arthralgias.    Updated Vital Signs BP (!) 156/96   Pulse 85   Temp 97.7 F (36.5 C) (Oral)   Resp 18   Ht 5' 1 (1.549 m)   Wt 103.4 kg   LMP  (LMP Unknown)   SpO2 98%   BMI 43.08 kg/m   Physical  Exam Vitals and nursing note reviewed.  Constitutional:      General: She is not in acute distress.    Appearance: Normal appearance. She is well-developed. She is not toxic-appearing.  HENT:     Head: Normocephalic and atraumatic.     Right Ear: External ear normal.     Left Ear: External ear normal.     Nose: Nose normal.  Eyes:     Conjunctiva/sclera: Conjunctivae normal.  Cardiovascular:     Rate and Rhythm: Normal rate and regular rhythm.     Pulses: Normal pulses.  Pulmonary:     Effort: Pulmonary effort is normal. No respiratory distress.  Abdominal:     Palpations: Abdomen is soft.     Tenderness: There is no abdominal tenderness.  Musculoskeletal:        General: Swelling and tenderness present. No deformity.     Cervical back: Neck supple.     Right lower leg: No edema.     Left lower leg: No edema.     Comments: Patient has mild swelling to right medial and lateral malleoli.  Patient endorses tenderness to right medial and lateral malleoli, proximal fifth metatarsal, and navicular.  Patient is neurovascularly intact.  Skin:    General: Skin is warm and dry.     Capillary Refill: Capillary refill takes less than 2 seconds.  Neurological:     Mental Status: She is alert.  Psychiatric:        Mood and Affect: Mood normal. Affect is angry.        Speech: Speech is tangential.        Behavior: Behavior is agitated and aggressive.     (all labs ordered are listed, but only abnormal results are displayed) Labs Reviewed - No data to display  EKG: None  Radiology: DG Ankle Complete Right Result Date: 09/22/2024 EXAM: 3 OR MORE VIEW(S) XRAY OF THE ANKLE 09/22/2024 11:01:00 PM CLINICAL HISTORY: injury injury injury COMPARISON: None available. FINDINGS: BONES AND JOINTS: No acute fracture. No malalignment. Small ossific density inferior to the tip of the lateral malleolus, age indeterminate. Corticated ossicles inferior to the medial malleolus, likely sequela of remote  injury. Calcaneal spur noted along the Plantar aponeurosis attachment site. SOFT TISSUES: Mild superficial soft tissue swelling. IMPRESSION: 1. Small ossific density inferior to the tip of the lateral malleolus, age indeterminate, which may represent an avulsion injury. 2. Mild superficial soft tissue swelling. 3. Chronic-appearing corticated ossicles inferior to the medial malleolus, likely sequela of remote injury. Electronically signed by: Greig Pique MD 09/22/2024 11:06 PM EST RP Workstation: [id number]   DG Foot Complete Right Result Date: 09/22/2024 EXAM: 3 OR MORE VIEW(S) XRAY OF THE FOOT 09/22/2024 11:01:00 PM COMPARISON: None available. CLINICAL  HISTORY: injury injury injury injury injury FINDINGS: BONES AND JOINTS: Small plantar calcaneal spur. No acute fracture. No malalignment. SOFT TISSUES: Unremarkable. IMPRESSION: 1. No acute findings. Electronically signed by: Greig Pique MD 09/22/2024 11:05 PM EST RP Workstation: [id number]     Procedures   Medications Ordered in the ED  ziprasidone  (GEODON ) injection 10 mg (has no administration in time range)  ketorolac  (TORADOL ) 15 MG/ML injection 15 mg (15 mg Intramuscular Given 09/22/24 2307)                                    Medical Decision Making Amount and/or Complexity of Data Reviewed Radiology: ordered.  Risk Prescription drug management.   This patient presents to the ED for concern of fall differential diagnosis includes fracture, dislocation, musculoskeletal pain   Imaging Studies ordered:  I ordered imaging studies including right foot and ankle x-rays I independently visualized and interpreted imaging which showed no acute findings in the foot.  Small ossific density inferior to the tip of the lateral malleolus, age-indeterminate, may represent an avulsion injury. I agree with the radiologist interpretation   Medicines ordered and prescription drug management:  I ordered medication including Toradol     I have  reviewed the patients home medicines and have made adjustments as needed   Problem List / ED Course:  Patient given cam boot and crutches. Considered for mission or further workup however patient's vital signs, physical exam, and imaging are reassuring.  Patient's symptoms may be due to small avulsion fracture of the right lateral malleolus.  Patient placed in cam boot and given crutches.  Patient advised to alternate Tylenol  Motrin .  Patient to follow-up with orthopedics for further evaluation workup.  I feel patient safer discharge at this time.     Final diagnoses:  Injury of right ankle, initial encounter    ED Discharge Orders     None          Francis Ileana SAILOR, PA-C 09/22/24 2319  "

## 2024-09-22 NOTE — Discharge Instructions (Signed)
 Today you were seen for a right ankle injury.  You may have a very small avulsion of your right fibula bone.  You may alternate Tylenol  Motrin  as needed for pain.  Please follow-up with Ozell Ned with orthopedics for further evaluation and workup.  Thank you for letting us  treat you today. After reviewing your imaging, I feel you are safe to go home. Please follow up with your PCP in the next several days and provide them with your records from this visit. Return to the Emergency Room if pain becomes severe or symptoms worsen.

## 2024-09-22 NOTE — ED Notes (Addendum)
 Pt. Refused Cam boot, crutches and vitals. She ambulated well. No sign of distress or discomfort. Re

## 2024-09-22 NOTE — ED Notes (Signed)
 Pt now lying with blanket over her face, in NAD, calm and cooperative

## 2024-09-23 ENCOUNTER — Emergency Department (HOSPITAL_COMMUNITY)
Admission: EM | Admit: 2024-09-23 | Discharge: 2024-09-23 | Disposition: A | Discharge status: Home / Self Care | Payer: MEDICAID | Attending: Emergency Medicine | Admitting: Emergency Medicine

## 2024-09-23 ENCOUNTER — Emergency Department (HOSPITAL_COMMUNITY): Payer: MEDICAID

## 2024-09-23 ENCOUNTER — Encounter (HOSPITAL_COMMUNITY): Payer: Self-pay | Admitting: Emergency Medicine

## 2024-09-23 DIAGNOSIS — M25532 Pain in left wrist: Secondary | ICD-10-CM | POA: Diagnosis present

## 2024-09-23 MED ORDER — IBUPROFEN 400 MG PO TABS
600.0000 mg | ORAL_TABLET | Freq: Once | ORAL | Status: AC
  Administered 2024-09-23: 600 mg via ORAL
  Filled 2024-09-23: qty 1

## 2024-09-23 NOTE — Discharge Instructions (Signed)
 As we discussed, there is no new injury to the left wrist seen on xray tonight. Wear the splint for comfort. Recommend ibuprofen  every 6 hours for pain and inflammation. Follow up with your doctor if pain is no better in 1 week.

## 2024-09-23 NOTE — ED Notes (Signed)
 Ortho tech called and answered for prefab wrist brace

## 2024-09-23 NOTE — Progress Notes (Signed)
 Orthopedic Tech Progress Note Patient Details:  Nicole Manning 16-Aug-1986 [id number]  Ortho Devices Type of Ortho Device: Velcro wrist splint Ortho Device/Splint Location: L WRIST Ortho Device/Splint Interventions: Ordered, Application   Post Interventions Patient Tolerated: Fair Instructions Provided: Care of device  Ming Kunka [name] 09/23/2024, 5:18 AM

## 2024-09-23 NOTE — ED Provider Notes (Signed)
 " [place] EM[facility]GENCY DEPARTMENT AT [facility] Provider Note   CSN: [id number] Arrival date & time: 09/23/24  0137     Patient presents with: Wrist Pain   Nicole Manning is a 39 y.o. female.   Patient to ED c/o left wrist pain. States she was arrested earlier in the evening and she feels the pain is from the handcuffs being too tight. No fall or other injury.  The history is provided by the patient. No language interpreter was used.  Wrist Pain       Prior to Admission medications  Medication Sig Start Date End Date Taking? Authorizing Provider  ALPRAZolam (XANAX) 1 MG tablet 1 tablet Orally Twice a day As needed    [provider]  azithromycin  (ZITHROMAX ) 250 MG tablet Take 1 tablet (250 mg total) by mouth daily. Take first 2 tablets together, then 1 every day until finished. 07/01/24   Ula Prentice SAUNDERS, MD  benzonatate  (TESSALON ) 100 MG capsule Take 1 capsule (100 mg total) by mouth every 8 (eight) hours. 12/02/20   Brien Therisa MATSU, MD  benztropine  (COGENTIN ) 1 MG tablet Take 1 tablet (1 mg total) by mouth 2 (two) times daily. 03/22/19   Malinda Rogue, MD  brompheniramine-pseudoephedrine-DM 30-2-10 MG/5ML syrup Take 5 mLs by mouth 4 (four) times daily as needed (Cough). 07/01/24   Ula Prentice SAUNDERS, MD  cyclobenzaprine  (FLEXERIL ) 10 MG tablet Take 1 tablet (10 mg total) by mouth 3 (three) times daily as needed for muscle spasms. 05/25/21   Geroldine Berg, MD  diclofenac  Sodium (VOLTAREN ) 1 % GEL Apply 2 g topically 4 (four) times daily. 03/02/24   Theotis Peers HERO, PA-C  DULoxetine (CYMBALTA) 30 MG capsule Take 60 mg by mouth at bedtime.    [provider]  ferrous sulfate 325 (65 FE) MG tablet 1 tablet Orally every other day    [provider]  fluconazole  (DIFLUCAN ) 150 MG tablet Take 1 tablet (150 mg total) by mouth every 3 (three) days. 08/26/24   Christopher Savannah, PA-C  gabapentin  (NEURONTIN ) 300 MG capsule 1 capsule.    [provider]   HYDROcodone -acetaminophen  (NORCO/VICODIN) 5-325 MG tablet Take 2 tablets by mouth every 4 (four) hours as needed. 04/01/23   Soto, Johana, PA-C  HYDROcodone -homatropine (HYCODAN) 5-1.5 MG/5ML syrup Take 5 mLs by mouth every 6 (six) hours as needed for cough. 12/02/20   Brien Therisa MATSU, MD  ibuprofen  (ADVIL ) 600 MG tablet Take 1 tablet (600 mg total) by mouth every 8 (eight) hours as needed. 03/19/21   Tammy Sor, PA-C  naproxen  (NAPROSYN ) 500 MG tablet Take 1 tablet (500 mg total) by mouth 2 (two) times daily. 09/30/21   Geroldine Berg, MD  omeprazole  (PRILOSEC) 20 MG capsule Take 1 capsule (20 mg total) by mouth daily. 06/01/20 07/01/20  Walisiewicz, Kaitlyn E, PA-C  risperiDONE  (RISPERDAL ) 2 MG tablet Take 2 mg by mouth at bedtime.    [provider]  risperiDONE  (RISPERDAL ) 3 MG tablet Take 2 tablets (6 mg total) by mouth at bedtime. Patient taking differently: Take 3 mg by mouth at bedtime. 03/22/19   Malinda Rogue, MD  sertraline (ZOLOFT) 50 MG tablet Take 50 mg by mouth daily. 01/13/19   [provider]  SYNJARDY XR 25-1000 MG TB24 Take 1 tablet by mouth every morning.    [provider]  topiramate  (TOPAMAX ) 25 MG tablet Take 50 mg by mouth 2 (two) times daily.  05/09/19   [provider]  trihexyphenidyl (ARTANE)  2 MG tablet Take 2 mg by mouth at bedtime.    [provider]  albuterol  (PROVENTIL  HFA;VENTOLIN  HFA) 108 (90 Base) MCG/ACT inhaler Inhale 2 puffs into the lungs every 6 (six) hours as needed for wheezing or shortness of breath. Patient not taking: Reported on 09/06/2019 08/26/18 09/25/19  Pearl Jenkins Lesches, NP    Allergies: Patient has no known allergies.    Review of Systems  Updated Vital Signs BP 115/71 (BP Location: Left Arm)   Pulse 80   Temp 97.9 F (36.6 C)   Resp 16   Ht 5' 1 (1.549 m)   Wt 103.4 kg   LMP  (LMP Unknown)   SpO2 98%   BMI 43.08 kg/m   Physical Exam Vitals and nursing note reviewed.  Constitutional:       General: She is not in acute distress.    Appearance: She is well-developed. She is not ill-appearing.  Pulmonary:     Effort: Pulmonary effort is normal.  Musculoskeletal:        General: Normal range of motion.     Cervical back: Normal range of motion.     Comments: No swelling visualized to left wrist. Dorsally tender. Moves all digits of the left hand. Neurovascularly intact. No proximal forearm tenderness.   Skin:    General: Skin is warm and dry.     Findings: No bruising.  Neurological:     Mental Status: She is alert and oriented to person, place, and time.     (all labs ordered are listed, but only abnormal results are displayed) Labs Reviewed - No data to display  EKG: None  Radiology: DG Wrist Complete Left Result Date: 09/23/2024 EXAM: 3 OR MORE VIEW(S) XRAY OF THE LEFT WRIST 09/23/2024 02:55:30 AM COMPARISON: None available. CLINICAL HISTORY: pain pain pain pain pain FINDINGS: BONES AND JOINTS: Well corticated ossific fragment adjacent to ulnar styloid. 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 No malalignment. SOFT TISSUES: Unremarkable. IMPRESSION: 1. Well corticated ossific fragment adjacent to the ulnar styloid, compatible with a remote ulnar styloid avulsion fracture. No acute abnormality noted. Electronically signed by: Oneil Devonshire MD 09/23/2024 03:05 AM EST RP Workstation: [id number]   DG Ankle Complete Right Result Date: 09/22/2024 EXAM: 3 OR MORE VIEW(S) XRAY OF THE ANKLE 09/22/2024 11:01:00 PM CLINICAL HISTORY: injury injury injury COMPARISON: None available. FINDINGS: BONES AND JOINTS: No acute fracture. No malalignment. Small ossific density inferior to the tip of the lateral malleolus, age indeterminate. Corticated ossicles inferior to the medial malleolus, likely sequela of remote injury. Calcaneal spur noted along the Plantar aponeurosis attachment site. SOFT TISSUES: Mild superficial soft tissue swelling. IMPRESSION: 1. Small ossific density inferior to the  tip of the lateral malleolus, age indeterminate, which may represent an avulsion injury. 2. Mild superficial soft tissue swelling. 3. Chronic-appearing corticated ossicles inferior to the medial malleolus, likely sequela of remote injury. Electronically signed by: Greig Pique MD 09/22/2024 11:06 PM EST RP Workstation: [id number]   DG Foot Complete Right Result Date: 09/22/2024 EXAM: 3 OR MORE VIEW(S) XRAY OF THE FOOT 09/22/2024 11:01:00 PM COMPARISON: None available. CLINICAL HISTORY: injury injury injury injury injury FINDINGS: BONES AND JOINTS: Small plantar calcaneal spur. No acute fracture. No malalignment. SOFT TISSUES: Unremarkable. IMPRESSION: 1. No acute findings. Electronically signed by: Greig Pique MD 09/22/2024 11:05 PM EST RP Workstation: [id number]     Procedures   Medications Ordered in the ED  ibuprofen  (ADVIL ) tablet 600 mg (has no administration in time range)  Clinical Course as of 09/23/24 0331  Sat Sep 23, 2024  [id number] Left wrist pain after handcuffs placed earlier tonight. Xray does not show any acute injury, however, there is a remote well corticated ulnar styloid injury. Velcro wrist splint provided. Recommended ibuprofen . PCP follow up prn. [SU]    Clinical Course User Index [SU] Odell Balls, PA-C                                 Medical Decision Making Amount and/or Complexity of Data Reviewed Radiology: ordered.        Final diagnoses:  Left wrist pain    ED Discharge Orders     None          Odell Balls, PA-C 09/23/24 [id number]    Jerral Meth, MD 09/23/24 0403  "

## 2024-09-23 NOTE — ED Triage Notes (Signed)
 Patient coming to ED for evaluation of L wrist pain.  Reports she was recently detained and sustained a wrist injury.  States they gave me a Tramadol  for the ankle pain, but this pain is worse.  I did not know it hurt until after they let me go.
